# Patient Record
Sex: Male | Born: 1960
Health system: Southern US, Community
[De-identification: ages and names within clinical notes are randomized; demographics above are authoritative.]

## PROBLEM LIST (undated history)

## (undated) DIAGNOSIS — F329 Major depressive disorder, single episode, unspecified: Secondary | ICD-10-CM

## (undated) DIAGNOSIS — L409 Psoriasis, unspecified: Secondary | ICD-10-CM

## (undated) DIAGNOSIS — F909 Attention-deficit hyperactivity disorder, unspecified type: Secondary | ICD-10-CM

## (undated) DIAGNOSIS — IMO0002 Reserved for concepts with insufficient information to code with codable children: Secondary | ICD-10-CM

## (undated) DIAGNOSIS — F32A Depression, unspecified: Secondary | ICD-10-CM

## (undated) DIAGNOSIS — N401 Enlarged prostate with lower urinary tract symptoms: Secondary | ICD-10-CM

## (undated) DIAGNOSIS — F319 Bipolar disorder, unspecified: Secondary | ICD-10-CM

## (undated) HISTORY — PX: COLONOSCOPY: SHX174

## (undated) HISTORY — DX: Bipolar disorder, unspecified: F31.9

## (undated) HISTORY — PX: TONSILLECTOMY: SUR1361

## (undated) HISTORY — DX: Psoriasis, unspecified: L40.9

---

## 2008-11-02 ENCOUNTER — Emergency Department (HOSPITAL_COMMUNITY): Admission: EM | Admit: 2008-11-02 | Discharge: 2008-11-02 | Payer: Self-pay | Admitting: Emergency Medicine

## 2010-08-03 LAB — URINALYSIS, ROUTINE W REFLEX MICROSCOPIC
Nitrite: NEGATIVE
Specific Gravity, Urine: 1.025 (ref 1.005–1.030)
Urobilinogen, UA: 0.2 mg/dL (ref 0.0–1.0)

## 2010-08-03 LAB — CBC
Hemoglobin: 16 g/dL (ref 13.0–17.0)
RBC: 4.43 MIL/uL (ref 4.22–5.81)
RDW: 12.2 % (ref 11.5–15.5)

## 2010-08-03 LAB — COMPREHENSIVE METABOLIC PANEL
ALT: 49 U/L (ref 0–53)
AST: 59 U/L — ABNORMAL HIGH (ref 0–37)
Alkaline Phosphatase: 65 U/L (ref 39–117)
GFR calc Af Amer: >60 mL/min (ref >60)
Glucose, Bld: 159 mg/dL — ABNORMAL HIGH (ref 70–99)
Potassium: 3.2 mEq/L — ABNORMAL LOW (ref 3.5–5.1)
Sodium: 140 mEq/L (ref 135–145)
Total Protein: 7.7 g/dL (ref 6.0–8.3)

## 2010-08-03 LAB — URINE MICROSCOPIC-ADD ON

## 2010-08-03 LAB — DIFFERENTIAL
Basophils Relative: 1 % (ref 0–1)
Eosinophils Absolute: 0 10*3/uL (ref 0.0–0.7)
Eosinophils Relative: 0 % (ref 0–5)
Lymphs Abs: 0.9 10*3/uL (ref 0.7–4.0)
Monocytes Absolute: 1 10*3/uL (ref 0.1–1.0)
Monocytes Relative: 7 % (ref 3–12)
Neutrophils Relative %: 86 % — ABNORMAL HIGH (ref 43–77)

## 2010-08-03 LAB — URINE CULTURE: Culture: NO GROWTH

## 2010-08-03 LAB — SAMPLE TO BLOOD BANK

## 2010-08-03 LAB — ETHANOL: Alcohol, Ethyl (B): <5 mg/dL (ref 0–10)

## 2010-08-03 LAB — PROTIME-INR: INR: 1 (ref 0.00–1.49)

## 2010-08-03 LAB — HEMOCCULT GUIAC POC 1CARD (OFFICE): Fecal Occult Bld: POSITIVE

## 2011-05-21 ENCOUNTER — Encounter (HOSPITAL_COMMUNITY): Payer: Self-pay | Admitting: Emergency Medicine

## 2011-05-21 ENCOUNTER — Emergency Department (HOSPITAL_COMMUNITY): Payer: BC Managed Care – PPO

## 2011-05-21 ENCOUNTER — Emergency Department (HOSPITAL_COMMUNITY)
Admission: EM | Admit: 2011-05-21 | Discharge: 2011-05-21 | Disposition: A | Discharge status: Home / Self Care | Payer: BC Managed Care – PPO | Attending: Emergency Medicine | Admitting: Emergency Medicine

## 2011-05-21 DIAGNOSIS — R112 Nausea with vomiting, unspecified: Secondary | ICD-10-CM | POA: Insufficient documentation

## 2011-05-21 DIAGNOSIS — G4700 Insomnia, unspecified: Secondary | ICD-10-CM | POA: Insufficient documentation

## 2011-05-21 DIAGNOSIS — R1110 Vomiting, unspecified: Secondary | ICD-10-CM

## 2011-05-21 HISTORY — DX: Depression, unspecified: F32.A

## 2011-05-21 HISTORY — DX: Major depressive disorder, single episode, unspecified: F32.9

## 2011-05-21 HISTORY — DX: Reserved for concepts with insufficient information to code with codable children: IMO0002

## 2011-05-21 LAB — URINALYSIS, ROUTINE W REFLEX MICROSCOPIC
Hgb urine dipstick: NEGATIVE
Ketones, ur: >80 mg/dL — AB
Specific Gravity, Urine: 1.02 (ref 1.005–1.030)
pH: 6 (ref 5.0–8.0)

## 2011-05-21 LAB — CK: Total CK: 134 U/L (ref 7–232)

## 2011-05-21 LAB — DIFFERENTIAL
Basophils Relative: 0 % (ref 0–1)
Eosinophils Absolute: 0 10*3/uL (ref 0.0–0.7)
Eosinophils Relative: 0 % (ref 0–5)
Lymphocytes Relative: 4 % — ABNORMAL LOW (ref 12–46)
Monocytes Relative: 6 % (ref 3–12)
Neutro Abs: 17.2 10*3/uL — ABNORMAL HIGH (ref 1.7–7.7)
Neutrophils Relative %: 90 % — ABNORMAL HIGH (ref 43–77)

## 2011-05-21 LAB — COMPREHENSIVE METABOLIC PANEL
ALT: 37 U/L (ref 0–53)
Alkaline Phosphatase: 87 U/L (ref 39–117)
BUN: 12 mg/dL (ref 6–23)
Chloride: 95 mEq/L — ABNORMAL LOW (ref 96–112)
GFR calc Af Amer: >90 mL/min (ref >90)
Glucose, Bld: 176 mg/dL — ABNORMAL HIGH (ref 70–99)
Potassium: 3.8 mEq/L (ref 3.5–5.1)
Sodium: 142 mEq/L (ref 135–145)
Total Bilirubin: 2.5 mg/dL — ABNORMAL HIGH (ref 0.3–1.2)
Total Protein: 8.3 g/dL (ref 6.0–8.3)

## 2011-05-21 LAB — URINE MICROSCOPIC-ADD ON

## 2011-05-21 LAB — LIPASE, BLOOD: Lipase: 122 U/L — ABNORMAL HIGH (ref 11–59)

## 2011-05-21 LAB — CBC
HCT: 46.1 % (ref 39.0–52.0)
Hemoglobin: 16.7 g/dL (ref 13.0–17.0)
RBC: 4.91 MIL/uL (ref 4.22–5.81)
WBC: 19.2 10*3/uL — ABNORMAL HIGH (ref 4.0–10.5)

## 2011-05-21 MED ORDER — ONDANSETRON HCL 4 MG/2ML IJ SOLN
4.0000 mg | Freq: Once | INTRAMUSCULAR | Status: AC
  Administered 2011-05-21: 4 mg via INTRAVENOUS
  Filled 2011-05-21 (×2): qty 2

## 2011-05-21 MED ORDER — GI COCKTAIL ~~LOC~~
30.0000 mL | Freq: Once | ORAL | Status: AC
  Administered 2011-05-21: 30 mL via ORAL
  Filled 2011-05-21: qty 30

## 2011-05-21 MED ORDER — SODIUM CHLORIDE 0.9 % IV SOLN
999.0000 mL | Freq: Once | INTRAVENOUS | Status: AC
  Administered 2011-05-21: 999 mL via INTRAVENOUS

## 2011-05-21 MED ORDER — LORAZEPAM 2 MG/ML IJ SOLN
1.0000 mg | Freq: Once | INTRAMUSCULAR | Status: AC
  Administered 2011-05-21: 1 mg via INTRAVENOUS
  Filled 2011-05-21: qty 1

## 2011-05-21 MED ORDER — ONDANSETRON HCL 4 MG PO TABS: 4.0000 mg | ORAL_TABLET | Freq: Four times a day (QID) | ORAL | Status: AC

## 2011-05-21 NOTE — ED Provider Notes (Signed)
History     CSN: [id number]  Arrival date & time 05/21/11  1733   First MD Initiated Contact with Patient 05/21/11 1734      Chief Complaint  Patient presents with  . Emesis    pt to md office with vomiting since this morning and unable to sleep for 3 nights. given zofran by ems but vomiting continues.    (Consider location/radiation/quality/duration/timing/severity/associated sxs/prior treatment) HPI The patient presents with 3 days of insomnia, new nausea, and vomiting.  He notes that prior to this episode of insomnia he has been in his usual state of health.  He does note that he recently started taking a new anxiolytic.  The insomnia began acutely 3 days ago, since that time he has not slept soundly.  Yesterday he gradually developed nausea and anorexia.  These have been persistent since yesterday.  Today his nausea progressed to vomiting.  After several episodes of vomiting the patient presented to his primary care physician's office.  He was referred here for further evaluation.  The patient denies any alleviating factors.  No clear exacerbating factors either.  No significant abdominal pain, chest pain, dyspnea, headache, confusion, disorientation, fevers, chills. Past Medical History  Diagnosis Date  . Ulcer   . Depression     No past surgical history on file.  No family history on file.  History  Substance Use Topics  . Smoking status: Not on file  . Smokeless tobacco: Not on file  . Alcohol Use:       Review of Systems  Constitutional:       Per HPI, otherwise negative  HENT:       Per HPI, otherwise negative  Eyes: Negative.   Respiratory:       Per HPI, otherwise negative  Cardiovascular:       Per HPI, otherwise negative  Gastrointestinal: Positive for nausea and vomiting. Negative for abdominal pain, diarrhea, constipation, blood in stool, abdominal distention and anal bleeding.  Genitourinary: Negative.   Musculoskeletal:       Per HPI, otherwise  negative  Skin: Negative.   Neurological: Negative for syncope.    Allergies  Review of patient's allergies indicates no known allergies.  Home Medications  No current outpatient prescriptions on file.  BP 153/114  Pulse 102  Temp(Src) 98.4 F (36.9 C) (Oral)  Resp 16  Ht 6' (1.829 m)  Wt 154 lb (69.854 kg)  BMI 20.89 kg/m2  SpO2 96%  Physical Exam  Nursing note and vitals reviewed. Constitutional: He is oriented to person, place, and time. He appears well-developed. No distress.       The patient is sitting, seemingly comfortably, with an emesis basin filled with emesis  HENT:  Head: Normocephalic and atraumatic.  Eyes: Conjunctivae and EOM are normal.  Cardiovascular: Normal rate and regular rhythm.   Pulmonary/Chest: Effort normal. No stridor. No respiratory distress.  Abdominal: He exhibits no distension.  Musculoskeletal: He exhibits no edema.  Neurological: He is alert and oriented to person, place, and time.  Skin: Skin is warm and dry.  Psychiatric: He has a normal mood and affect.    ED Course  Procedures (including critical care time)   Labs Reviewed  CBC  DIFFERENTIAL  COMPREHENSIVE METABOLIC PANEL  LIPASE, BLOOD  URINALYSIS, ROUTINE W REFLEX MICROSCOPIC  LACTIC ACID, PLASMA   No results found.   No diagnosis found.  Cardiac monitor: 102, sinus tach, abnormal  MDM  This previously well male now presents with several days of  polydipsia, nausea, vomiting, insomnia.  The patient is awake, alert, oriented, afebrile.  Patient's labs are notable for mild lipase elevation and transaminase elevation.  Given his description of nausea vomiting, he had an ultrasound which did not demonstrate acute pathology.  The patient noted a significant improvement in his clinical condition following IV fluids and medications.  Patient's presentation is most consistent with a viral etiology, though there is some consideration of Cymbalta use, given that this is a new  medication, and noted side effects include the patient's symptoms.  The patient was advised to stop using Cymbalta, continue to stay well-hydrated, and to follow up with his primary care physician to continue evaluation of today's events.        Gerhard Munch, MD 05/21/11 [phone]

## 2011-05-21 NOTE — ED Notes (Signed)
N/v since this morning

## 2011-05-21 NOTE — ED Notes (Signed)
Patient in ultrasound. Will administer ordered medication upon his return

## 2011-05-21 NOTE — ED Notes (Signed)
Pt tolerating sips of gingerale without difficulty, states is feeling better. Ns bolus completed.

## 2013-05-31 IMAGING — US US ABDOMEN LIMITED
1 series · 14 of 25 positions shown · non-contrast
Comparison: None.

CLINICAL DATA: Nausea and vomiting.  Elevated lipase and liver
function tests.

COMPLETE ABDOMINAL ULTRASOUND

[Series 1: us abdomen limited · 0.27mm/px · 14 of 50 slices shown]
[im 1/50]
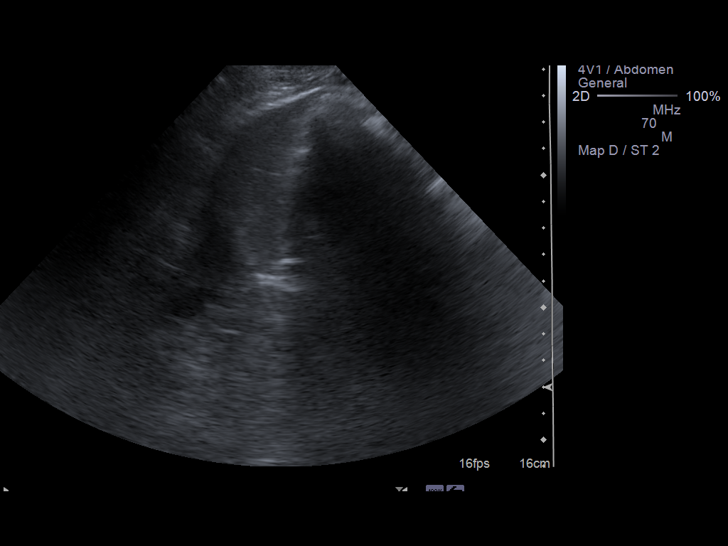
[im 5/50]
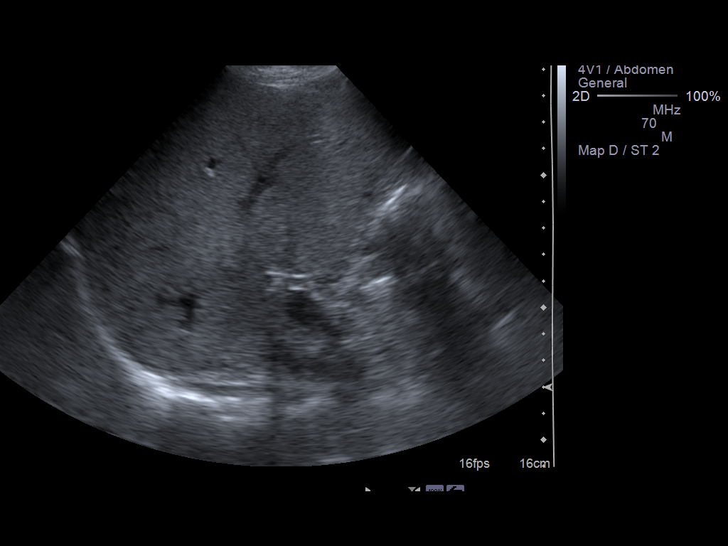
[im 9/50]
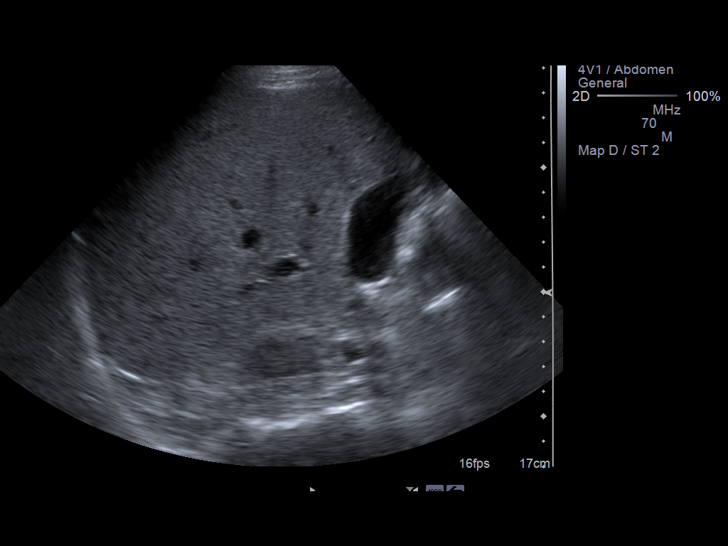
[im 13/50]
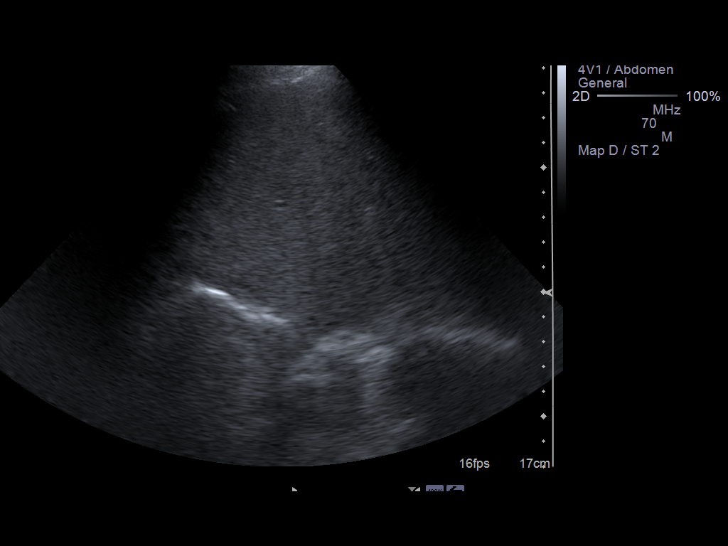
[im 17/50]
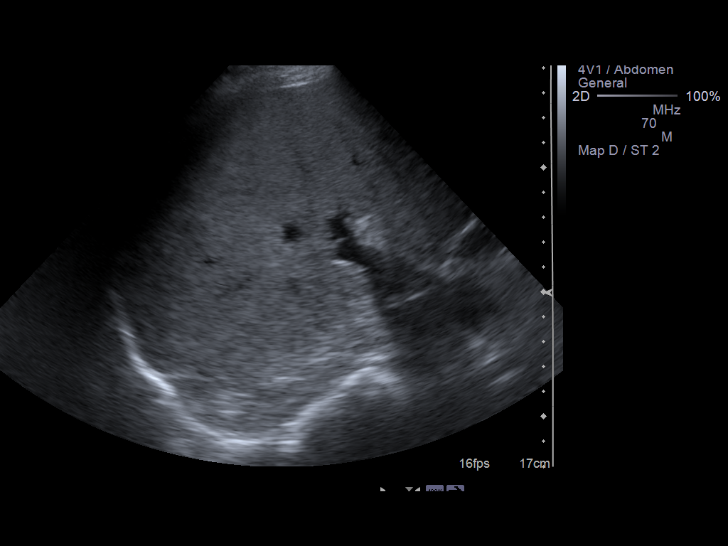
[im 19/50]
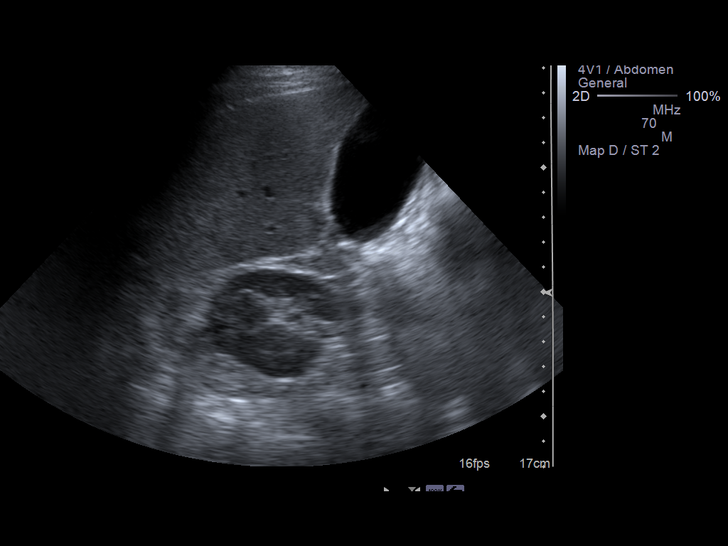
[im 23/50]
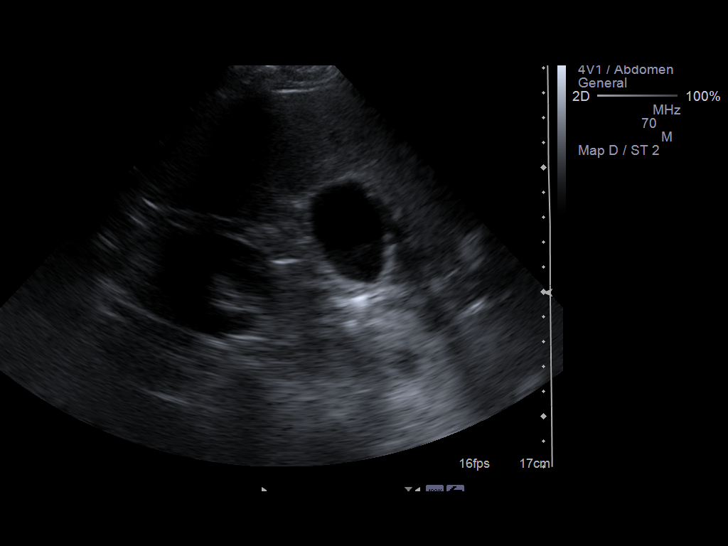
[im 27/50]
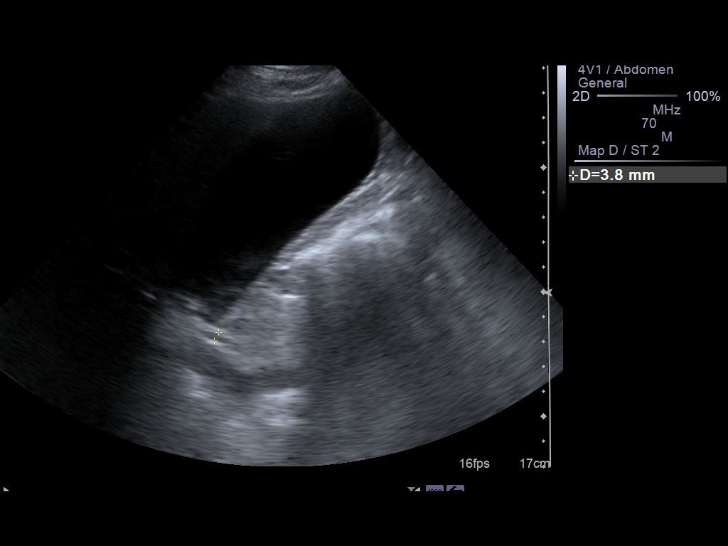
[im 31/50]
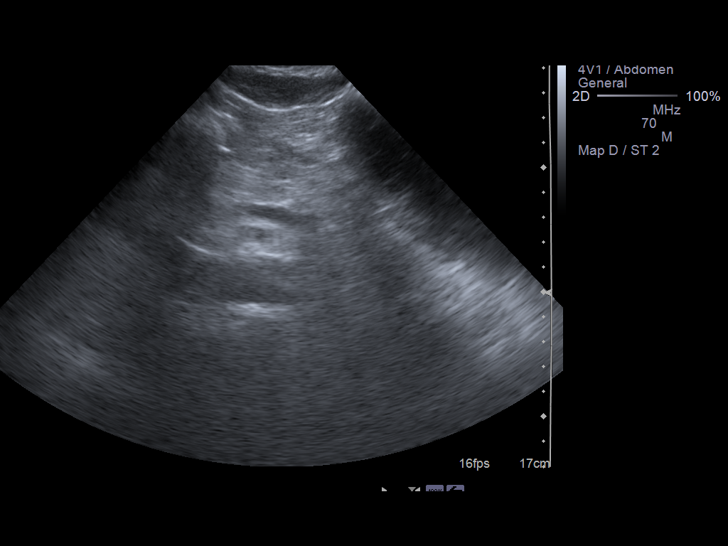
[im 33/50]
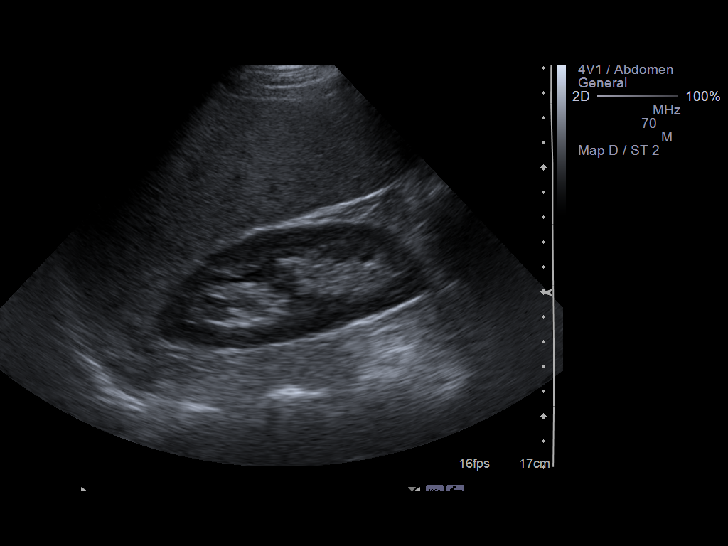
[im 37/50]
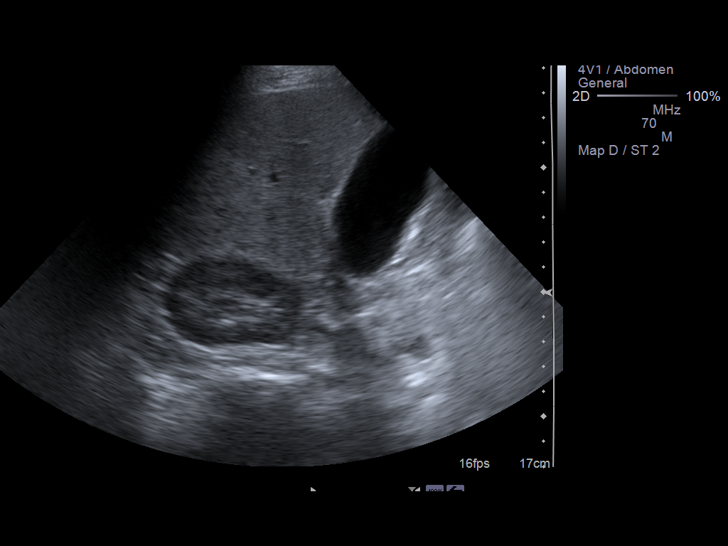
[im 41/50]
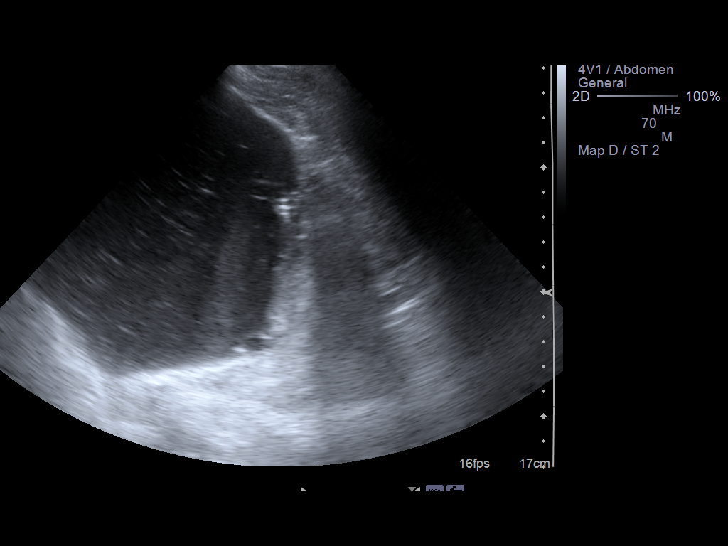
[im 45/50]
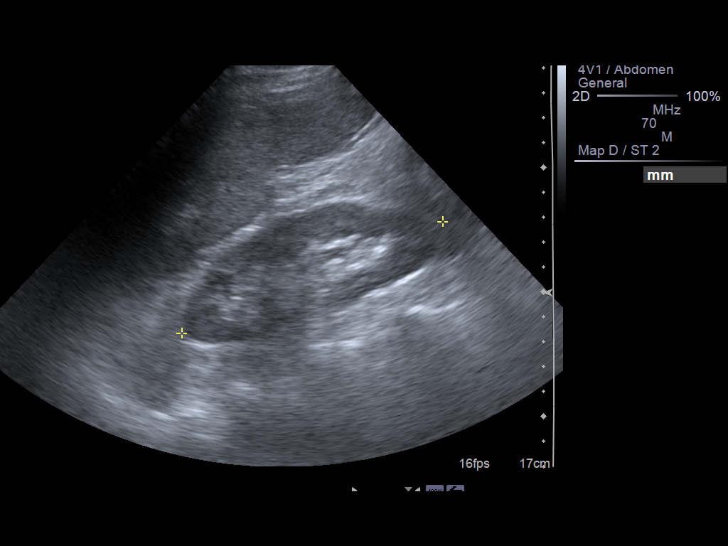
[im 50/50]
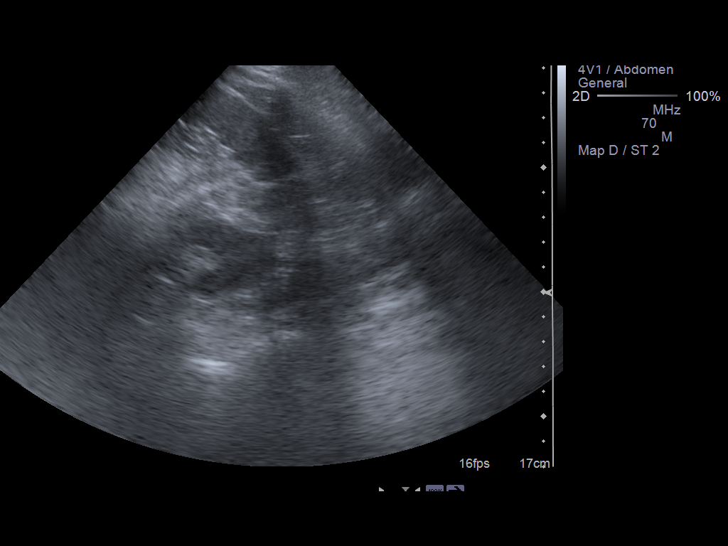

[14 of 25 positions shown; findings below may reference images not displayed]

FINDINGS: Gallbladder:  A small amount of sludge is present in the
gallbladder.  No gallbladder wall thickening or pericholecystic
fluid is identified.  No stone is seen.  Sonographer reports
negative Murphy's sign.

Common bile duct:  Measures 0.5 cm.

Liver:  No focal lesion identified.  Within normal limits in
parenchymal echogenicity.

IVC:  Appears normal.

Pancreas:  No focal abnormality seen.

Spleen:  Measures 13.1 cm and appears normal.

Right Kidney:  Measures 11.3 cm and appears normal.

Left Kidney:  Measures measures 11.4 cm and appears normal.

Abdominal aorta:  No aneurysm identified.
IMPRESSION: Small amount of gallbladder sludge.  No stones or evidence of
cholecystitis.  The examination is otherwise unremarkable.

## 2015-05-01 DIAGNOSIS — F1020 Alcohol dependence, uncomplicated: Secondary | ICD-10-CM | POA: Diagnosis not present

## 2015-05-06 MED FILL — HUMIRA PEN 40 MG/0.8ML PNKT: 40 | 28 days supply | Qty: 2 | Fill #1

## 2015-05-07 MED FILL — LITHIUM ER 450 MG TABLET: 450 | 30 days supply | Qty: 75 | Fill #1

## 2015-05-13 DIAGNOSIS — R3915 Urgency of urination: Secondary | ICD-10-CM | POA: Diagnosis not present

## 2015-05-13 DIAGNOSIS — N390 Urinary tract infection, site not specified: Secondary | ICD-10-CM | POA: Diagnosis not present

## 2015-05-13 MED FILL — CIPROFLOXACIN HCL 500 MG TA: 500 | 7 days supply | Qty: 14 | Fill #0

## 2015-05-15 DIAGNOSIS — F3132 Bipolar disorder, current episode depressed, moderate: Secondary | ICD-10-CM | POA: Diagnosis not present

## 2015-05-15 DIAGNOSIS — F1020 Alcohol dependence, uncomplicated: Secondary | ICD-10-CM | POA: Diagnosis not present

## 2015-05-17 DIAGNOSIS — M79661 Pain in right lower leg: Secondary | ICD-10-CM | POA: Diagnosis not present

## 2015-05-17 DIAGNOSIS — R5383 Other fatigue: Secondary | ICD-10-CM | POA: Diagnosis not present

## 2015-05-17 DIAGNOSIS — R270 Ataxia, unspecified: Secondary | ICD-10-CM | POA: Diagnosis not present

## 2015-05-17 DIAGNOSIS — R945 Abnormal results of liver function studies: Secondary | ICD-10-CM | POA: Diagnosis not present

## 2015-05-17 DIAGNOSIS — E039 Hypothyroidism, unspecified: Secondary | ICD-10-CM | POA: Diagnosis not present

## 2015-05-20 MED FILL — QUETIAPINE FUMARATE 50 MG T: 50 | 30 days supply | Qty: 90 | Fill #4

## 2015-05-21 DIAGNOSIS — R3915 Urgency of urination: Secondary | ICD-10-CM | POA: Diagnosis not present

## 2015-05-21 DIAGNOSIS — T56891A Toxic effect of other metals, accidental (unintentional), initial encounter: Secondary | ICD-10-CM | POA: Diagnosis not present

## 2015-05-21 DIAGNOSIS — R945 Abnormal results of liver function studies: Secondary | ICD-10-CM | POA: Diagnosis not present

## 2015-05-21 DIAGNOSIS — R251 Tremor, unspecified: Secondary | ICD-10-CM | POA: Diagnosis not present

## 2015-05-29 DIAGNOSIS — F1020 Alcohol dependence, uncomplicated: Secondary | ICD-10-CM | POA: Diagnosis not present

## 2015-05-31 DIAGNOSIS — F3177 Bipolar disorder, in partial remission, most recent episode mixed: Secondary | ICD-10-CM | POA: Diagnosis not present

## 2015-05-31 DIAGNOSIS — Z79899 Other long term (current) drug therapy: Secondary | ICD-10-CM | POA: Diagnosis not present

## 2015-06-04 DIAGNOSIS — R3915 Urgency of urination: Secondary | ICD-10-CM | POA: Diagnosis not present

## 2015-06-04 DIAGNOSIS — D72829 Elevated white blood cell count, unspecified: Secondary | ICD-10-CM | POA: Diagnosis not present

## 2015-06-04 DIAGNOSIS — E039 Hypothyroidism, unspecified: Secondary | ICD-10-CM | POA: Diagnosis not present

## 2015-06-12 DIAGNOSIS — F1020 Alcohol dependence, uncomplicated: Secondary | ICD-10-CM | POA: Diagnosis not present

## 2015-06-19 MED FILL — HUMIRA PEN 40 MG/0.8ML PNKT: 40 | 28 days supply | Qty: 2 | Fill #2

## 2015-06-20 DIAGNOSIS — N390 Urinary tract infection, site not specified: Secondary | ICD-10-CM | POA: Diagnosis not present

## 2015-06-20 DIAGNOSIS — N410 Acute prostatitis: Secondary | ICD-10-CM | POA: Diagnosis not present

## 2015-06-20 DIAGNOSIS — Z0000 Encounter for general adult medical examination without abnormal findings: Secondary | ICD-10-CM | POA: Diagnosis not present

## 2015-06-20 DIAGNOSIS — R3915 Urgency of urination: Secondary | ICD-10-CM | POA: Diagnosis not present

## 2015-06-20 DIAGNOSIS — N486 Induration penis plastica: Secondary | ICD-10-CM | POA: Diagnosis not present

## 2015-06-20 DIAGNOSIS — N411 Chronic prostatitis: Secondary | ICD-10-CM | POA: Diagnosis not present

## 2015-06-20 DIAGNOSIS — Z125 Encounter for screening for malignant neoplasm of prostate: Secondary | ICD-10-CM | POA: Diagnosis not present

## 2015-06-20 MED FILL — MELOXICAM 15 MG TABLET: 15 | 15 days supply | Qty: 15 | Fill #0

## 2015-06-20 MED FILL — CIPROFLOXACIN HCL 500 MG TA: 500 | 30 days supply | Qty: 60 | Fill #0

## 2015-06-21 MED FILL — LITHIUM ER 450 MG TABLET: 450 | 30 days supply | Qty: 45 | Fill #0

## 2015-06-26 DIAGNOSIS — F1020 Alcohol dependence, uncomplicated: Secondary | ICD-10-CM | POA: Diagnosis not present

## 2015-06-27 DIAGNOSIS — F3132 Bipolar disorder, current episode depressed, moderate: Secondary | ICD-10-CM | POA: Diagnosis not present

## 2015-07-10 DIAGNOSIS — F3132 Bipolar disorder, current episode depressed, moderate: Secondary | ICD-10-CM | POA: Diagnosis not present

## 2015-07-10 DIAGNOSIS — F1020 Alcohol dependence, uncomplicated: Secondary | ICD-10-CM | POA: Diagnosis not present

## 2015-07-18 DIAGNOSIS — R3915 Urgency of urination: Secondary | ICD-10-CM | POA: Diagnosis not present

## 2015-07-18 DIAGNOSIS — Z0000 Encounter for general adult medical examination without abnormal findings: Secondary | ICD-10-CM | POA: Diagnosis not present

## 2015-07-18 DIAGNOSIS — N486 Induration penis plastica: Secondary | ICD-10-CM | POA: Diagnosis not present

## 2015-07-22 MED FILL — HUMIRA PEN 40 MG/0.8ML PNKT: 40 | 28 days supply | Qty: 2 | Fill #3

## 2015-07-24 DIAGNOSIS — F1020 Alcohol dependence, uncomplicated: Secondary | ICD-10-CM | POA: Diagnosis not present

## 2015-08-01 MED FILL — LITHIUM ER 450 MG TABLET: 450 | 30 days supply | Qty: 45 | Fill #1

## 2015-08-02 DIAGNOSIS — F3132 Bipolar disorder, current episode depressed, moderate: Secondary | ICD-10-CM | POA: Diagnosis not present

## 2015-08-15 MED FILL — QUETIAPINE FUMARATE 50 MG T: 50 | 30 days supply | Qty: 90 | Fill #5

## 2015-08-20 DIAGNOSIS — F3132 Bipolar disorder, current episode depressed, moderate: Secondary | ICD-10-CM | POA: Diagnosis not present

## 2015-08-21 DIAGNOSIS — F1020 Alcohol dependence, uncomplicated: Secondary | ICD-10-CM | POA: Diagnosis not present

## 2015-09-02 MED FILL — HUMIRA PEN 40 MG/0.8ML PNKT: 40 | 28 days supply | Qty: 2 | Fill #4

## 2015-09-03 DIAGNOSIS — E039 Hypothyroidism, unspecified: Secondary | ICD-10-CM | POA: Diagnosis not present

## 2015-09-03 DIAGNOSIS — B356 Tinea cruris: Secondary | ICD-10-CM | POA: Diagnosis not present

## 2015-09-03 MED FILL — CLOTRIMAZOLE-BETAMETHASONE: 1-0.05 | 10 days supply | Qty: 45 | Fill #0

## 2015-09-04 DIAGNOSIS — F1020 Alcohol dependence, uncomplicated: Secondary | ICD-10-CM | POA: Diagnosis not present

## 2015-09-18 DIAGNOSIS — F1020 Alcohol dependence, uncomplicated: Secondary | ICD-10-CM | POA: Diagnosis not present

## 2015-09-23 MED FILL — LITHIUM ER 450 MG TABLET: 450 | 30 days supply | Qty: 45 | Fill #0

## 2015-10-02 DIAGNOSIS — F3132 Bipolar disorder, current episode depressed, moderate: Secondary | ICD-10-CM | POA: Diagnosis not present

## 2015-10-04 MED FILL — HUMIRA PEN 40 MG/0.8ML PNKT: 40 | 28 days supply | Qty: 2 | Fill #0

## 2015-10-09 DIAGNOSIS — F1020 Alcohol dependence, uncomplicated: Secondary | ICD-10-CM | POA: Diagnosis not present

## 2015-10-14 ENCOUNTER — Ambulatory Visit (HOSPITAL_BASED_OUTPATIENT_CLINIC_OR_DEPARTMENT_OTHER): Payer: Self-pay | Admitting: Pharmacist

## 2015-10-14 DIAGNOSIS — L409 Psoriasis, unspecified: Secondary | ICD-10-CM

## 2015-10-14 NOTE — Progress Notes (Signed)
S: Patient presents today to the [facility].  Patient is currently taking Humira for psoriasis. Patient is managed by Dr. Denna Haggard for this.   Adherence: denies any missed doses  Dosing:  Plaque psoriasis: SubQ: Initial: 80 mg as a single dose Maintenance: 40 mg every other week beginning 1 week after initial dose  Drug-drug interactions: none  Screening: TB test: completed Hepatitis: completed  Monitoring: S/sx of infection: denies CBC: WNL S/sx of hypersensitivity: denies S/sx of malignancy: denies S/sx of heart failure: denies     O:     Lab Results  Component Value Date   WBC 19.2* 05/21/2011   HGB 16.7 05/21/2011   HCT 46.1 05/21/2011   MCV 93.9 05/21/2011   PLT 216 05/21/2011      Chemistry      Component Value Date/Time   NA 142 05/21/2011 1753   K 3.8 05/21/2011 1753   CL 95* 05/21/2011 1753   CO2 16* 05/21/2011 1753   BUN 12 05/21/2011 1753   CREATININE 1.08 05/21/2011 1753      Component Value Date/Time   CALCIUM 9.8 05/21/2011 1753   ALKPHOS 87 05/21/2011 1753   AST 48* 05/21/2011 1753   ALT 37 05/21/2011 1753   BILITOT 2.5* 05/21/2011 1753     Reviewed labs from 2017 in [facility] - all pertinent labs WNL.  A/P: 1. Medication review: Patient on Humira for psoriasis and is tolerating it well with self-reported significant improvement in psoriasis. Reviewed the medication with the patient, including the following: Humira is a TNF blocking agent indicated for ankylosing spondylitis, Crohn's disease, Hidradenitis suppurativa, psoriatic arthritis, plaque psoriasis, ulcerative colitis, and uveitis. The most common adverse effects are infections, headache, and injection site reactions. There is the possibility of an increased risk of malignancy but it is not well understood if this increased risk is due to there medication or the disease state. There are rare cases of pancytopenia and aplastic anemia. No  recommendations for changes.    Nicoletta Ba, PharmD, BCPS, [facility] [phone]  Evaluation and management procedures were performed by the Clinical Pharmacy Practitioner under my supervision and collaboration. I have reviewed the Practitioner's note and chart, and I agree with the management and plan as documented above.   Angelica Chessman, MD, [place], [place], [place], [place] [facility] [place], [place]   10/14/2015, 3:47 PM

## 2015-10-15 DIAGNOSIS — F3132 Bipolar disorder, current episode depressed, moderate: Secondary | ICD-10-CM | POA: Diagnosis not present

## 2015-10-16 DIAGNOSIS — F1020 Alcohol dependence, uncomplicated: Secondary | ICD-10-CM | POA: Diagnosis not present

## 2015-10-17 MED FILL — LITHIUM ER 450 MG TABLET: 450 | 30 days supply | Qty: 45 | Fill #0

## 2015-10-25 DIAGNOSIS — F3132 Bipolar disorder, current episode depressed, moderate: Secondary | ICD-10-CM | POA: Diagnosis not present

## 2015-11-04 ENCOUNTER — Other Ambulatory Visit: Payer: Self-pay | Admitting: Pharmacist

## 2015-11-04 MED ORDER — ADALIMUMAB 40 MG/0.8ML ~~LOC~~ AJKT: 40.0000 mg | AUTO-INJECTOR | SUBCUTANEOUS | Status: DC

## 2015-11-04 MED FILL — HUMIRA PEN 40 MG/0.8ML PNKT: 40 | 28 days supply | Qty: 2 | Fill #0

## 2015-11-13 DIAGNOSIS — F1020 Alcohol dependence, uncomplicated: Secondary | ICD-10-CM | POA: Diagnosis not present

## 2015-11-15 MED FILL — LITHIUM ER 450 MG TABLET: 450 | 30 days supply | Qty: 45 | Fill #1

## 2015-11-15 MED FILL — QUETIAPINE FUMARATE 50 MG T: 50 | 30 days supply | Qty: 90 | Fill #0

## 2015-11-26 ENCOUNTER — Other Ambulatory Visit: Payer: Self-pay | Admitting: Internal Medicine

## 2015-11-27 ENCOUNTER — Other Ambulatory Visit: Payer: Self-pay | Admitting: Pharmacist

## 2015-11-27 DIAGNOSIS — F1020 Alcohol dependence, uncomplicated: Secondary | ICD-10-CM | POA: Diagnosis not present

## 2015-11-27 MED ORDER — ADALIMUMAB 40 MG/0.8ML ~~LOC~~ AJKT: 40.0000 mg | AUTO-INJECTOR | SUBCUTANEOUS | 0 refills | Status: DC

## 2015-12-02 MED FILL — HUMIRA PEN 40 MG/0.8ML PNKT: 40 | 28 days supply | Qty: 2 | Fill #0

## 2015-12-04 DIAGNOSIS — L409 Psoriasis, unspecified: Secondary | ICD-10-CM | POA: Diagnosis not present

## 2015-12-11 DIAGNOSIS — F1020 Alcohol dependence, uncomplicated: Secondary | ICD-10-CM | POA: Diagnosis not present

## 2015-12-13 DIAGNOSIS — F3132 Bipolar disorder, current episode depressed, moderate: Secondary | ICD-10-CM | POA: Diagnosis not present

## 2015-12-13 MED FILL — LITHIUM ER 450 MG TABLET: 450 | 30 days supply | Qty: 45 | Fill #2

## 2015-12-23 ENCOUNTER — Other Ambulatory Visit: Payer: Self-pay | Admitting: Pharmacist

## 2015-12-23 MED ORDER — ADALIMUMAB 40 MG/0.8ML ~~LOC~~ AJKT: 40.0000 mg | AUTO-INJECTOR | SUBCUTANEOUS | 5 refills | Status: DC

## 2015-12-23 MED FILL — HUMIRA PEN 40 MG/0.8ML PNKT: 40 | 28 days supply | Qty: 2 | Fill #0

## 2016-01-07 DIAGNOSIS — F3132 Bipolar disorder, current episode depressed, moderate: Secondary | ICD-10-CM | POA: Diagnosis not present

## 2016-01-08 DIAGNOSIS — F1020 Alcohol dependence, uncomplicated: Secondary | ICD-10-CM | POA: Diagnosis not present

## 2016-01-16 MED FILL — LITHIUM ER 450 MG TABLET: 450 | 30 days supply | Qty: 45 | Fill #3

## 2016-01-27 MED FILL — HUMIRA PEN 40 MG/0.8ML PNKT: 40 | 28 days supply | Qty: 2 | Fill #1

## 2016-02-04 DIAGNOSIS — M5441 Lumbago with sciatica, right side: Secondary | ICD-10-CM | POA: Diagnosis not present

## 2016-02-13 MED FILL — LITHIUM ER 450 MG TABLET: 450 | 30 days supply | Qty: 45 | Fill #0

## 2016-02-13 MED FILL — QUETIAPINE FUMARATE 50 MG T: 50 | 30 days supply | Qty: 90 | Fill #1

## 2016-02-27 DIAGNOSIS — E039 Hypothyroidism, unspecified: Secondary | ICD-10-CM | POA: Diagnosis not present

## 2016-02-27 DIAGNOSIS — Z23 Encounter for immunization: Secondary | ICD-10-CM | POA: Diagnosis not present

## 2016-02-27 DIAGNOSIS — E7800 Pure hypercholesterolemia, unspecified: Secondary | ICD-10-CM | POA: Diagnosis not present

## 2016-02-27 DIAGNOSIS — Z0000 Encounter for general adult medical examination without abnormal findings: Secondary | ICD-10-CM | POA: Diagnosis not present

## 2016-02-27 DIAGNOSIS — Z125 Encounter for screening for malignant neoplasm of prostate: Secondary | ICD-10-CM | POA: Diagnosis not present

## 2016-02-27 DIAGNOSIS — F321 Major depressive disorder, single episode, moderate: Secondary | ICD-10-CM | POA: Diagnosis not present

## 2016-03-06 MED FILL — HUMIRA PEN 40 MG/0.8ML PNKT: 40 | 28 days supply | Qty: 2 | Fill #2

## 2016-03-13 MED FILL — LITHIUM ER 450 MG TABLET: 450 | 30 days supply | Qty: 45 | Fill #1

## 2016-04-01 DIAGNOSIS — D72829 Elevated white blood cell count, unspecified: Secondary | ICD-10-CM | POA: Diagnosis not present

## 2016-04-12 MED FILL — HUMIRA PEN 40 MG/0.8ML PNKT: 40 | 28 days supply | Qty: 2 | Fill #3

## 2016-04-12 MED FILL — LITHIUM ER 450 MG TABLET: 450 | 30 days supply | Qty: 45 | Fill #2

## 2016-04-28 DIAGNOSIS — F3132 Bipolar disorder, current episode depressed, moderate: Secondary | ICD-10-CM | POA: Diagnosis not present

## 2016-04-28 MED FILL — ZALEPLON 10 MG CAPSULE: 10 | 30 days supply | Qty: 30 | Fill #0

## 2016-05-13 MED FILL — QUETIAPINE FUMARATE 50 MG T: 50 | 30 days supply | Qty: 90 | Fill #2

## 2016-05-13 MED FILL — LITHIUM ER 450 MG TABLET: 450 | 30 days supply | Qty: 45 | Fill #3

## 2016-05-18 MED FILL — HUMIRA PEN 40 MG/0.8ML PNKT: 40 | 28 days supply | Qty: 2 | Fill #4

## 2016-06-16 MED FILL — LITHIUM ER 450 MG TABLET: 450 | 30 days supply | Qty: 45 | Fill #0

## 2016-06-24 DIAGNOSIS — C4401 Basal cell carcinoma of skin of lip: Secondary | ICD-10-CM | POA: Diagnosis not present

## 2016-06-24 DIAGNOSIS — D229 Melanocytic nevi, unspecified: Secondary | ICD-10-CM | POA: Diagnosis not present

## 2016-06-24 DIAGNOSIS — L409 Psoriasis, unspecified: Secondary | ICD-10-CM | POA: Diagnosis not present

## 2016-06-24 DIAGNOSIS — L4052 Psoriatic arthritis mutilans: Secondary | ICD-10-CM | POA: Diagnosis not present

## 2016-07-15 MED FILL — LITHIUM ER 450 MG TABLET: 450 | 30 days supply | Qty: 45 | Fill #1

## 2016-07-17 DIAGNOSIS — K529 Noninfective gastroenteritis and colitis, unspecified: Secondary | ICD-10-CM | POA: Diagnosis not present

## 2016-07-21 MED FILL — PANTOPRAZOLE SOD DR 40 MG T: 40 | 30 days supply | Qty: 30 | Fill #0

## 2016-08-11 MED FILL — LITHIUM ER 450 MG TABLET: 450 | 30 days supply | Qty: 45 | Fill #2

## 2016-08-12 MED FILL — QUETIAPINE FUMARATE 50 MG T: 50 | 30 days supply | Qty: 90 | Fill #0

## 2016-08-17 MED FILL — CLOTRIMAZOLE-BETAMETHASONE: 1-0.05 | 10 days supply | Qty: 45 | Fill #0

## 2016-08-19 DIAGNOSIS — F3176 Bipolar disorder, in full remission, most recent episode depressed: Secondary | ICD-10-CM | POA: Diagnosis not present

## 2016-08-19 DIAGNOSIS — F3132 Bipolar disorder, current episode depressed, moderate: Secondary | ICD-10-CM | POA: Diagnosis not present

## 2016-08-20 MED FILL — PANTOPRAZOLE SOD DR 40 MG T: 40 | 30 days supply | Qty: 30 | Fill #0

## 2016-09-09 MED FILL — LITHIUM ER 450 MG TABLET: 450 | 30 days supply | Qty: 45 | Fill #3

## 2016-09-17 MED FILL — HUMIRA PEN 40 MG/0.8ML PNKT: 40 | 28 days supply | Qty: 2 | Fill #5

## 2016-10-01 DIAGNOSIS — F3176 Bipolar disorder, in full remission, most recent episode depressed: Secondary | ICD-10-CM | POA: Diagnosis not present

## 2016-10-05 MED FILL — LITHIUM ER 450 MG TABLET: 450 | 30 days supply | Qty: 60 | Fill #0

## 2016-10-06 DIAGNOSIS — H524 Presbyopia: Secondary | ICD-10-CM | POA: Diagnosis not present

## 2016-10-22 ENCOUNTER — Other Ambulatory Visit: Payer: Self-pay | Admitting: Pharmacist

## 2016-10-22 MED ORDER — ADALIMUMAB 40 MG/0.4ML ~~LOC~~ PSKT: 40.0000 mg | PREFILLED_SYRINGE | SUBCUTANEOUS | 5 refills | Status: DC

## 2016-10-22 MED ORDER — ADALIMUMAB 40 MG/0.8ML ~~LOC~~ AJKT: 40.0000 mg | AUTO-INJECTOR | SUBCUTANEOUS | 5 refills | Status: DC

## 2016-10-22 MED FILL — HUMIRA PEN 40 MG/0.8ML PNKT: 40 | 28 days supply | Qty: 2 | Fill #0

## 2016-11-03 MED FILL — LITHIUM ER 450 MG TABLET: 450 | 30 days supply | Qty: 60 | Fill #0

## 2016-11-11 MED FILL — QUETIAPINE FUMARATE 50 MG T: 50 | 30 days supply | Qty: 90 | Fill #1

## 2016-11-23 MED FILL — HUMIRA PEN 40 MG/0.8ML PNKT: 40 | 28 days supply | Qty: 2 | Fill #1

## 2016-12-02 MED FILL — LITHIUM ER 450 MG TABLET: 450 | 30 days supply | Qty: 60 | Fill #1

## 2016-12-31 MED FILL — LITHIUM ER 450 MG TABLET: 450 | 30 days supply | Qty: 60 | Fill #2

## 2016-12-31 MED FILL — HUMIRA PEN 40 MG/0.8ML PNKT: 40 | 28 days supply | Qty: 2 | Fill #2

## 2017-02-03 DIAGNOSIS — F3132 Bipolar disorder, current episode depressed, moderate: Secondary | ICD-10-CM | POA: Diagnosis not present

## 2017-02-04 MED FILL — LITHIUM ER 450 MG TABLET: 450 | 30 days supply | Qty: 60 | Fill #0

## 2017-02-04 MED FILL — QUETIAPINE FUMARATE 50 MG T: 50 | 30 days supply | Qty: 90 | Fill #0

## 2017-02-22 MED FILL — HUMIRA PEN 40 MG/0.8ML PNKT: 40 | 28 days supply | Qty: 2 | Fill #3

## 2017-03-01 DIAGNOSIS — Z23 Encounter for immunization: Secondary | ICD-10-CM | POA: Diagnosis not present

## 2017-03-01 DIAGNOSIS — Z79899 Other long term (current) drug therapy: Secondary | ICD-10-CM | POA: Diagnosis not present

## 2017-03-01 DIAGNOSIS — E039 Hypothyroidism, unspecified: Secondary | ICD-10-CM | POA: Diagnosis not present

## 2017-03-01 DIAGNOSIS — Z13220 Encounter for screening for lipoid disorders: Secondary | ICD-10-CM | POA: Diagnosis not present

## 2017-03-01 DIAGNOSIS — Z0000 Encounter for general adult medical examination without abnormal findings: Secondary | ICD-10-CM | POA: Diagnosis not present

## 2017-03-01 DIAGNOSIS — Z125 Encounter for screening for malignant neoplasm of prostate: Secondary | ICD-10-CM | POA: Diagnosis not present

## 2017-03-01 DIAGNOSIS — Z111 Encounter for screening for respiratory tuberculosis: Secondary | ICD-10-CM | POA: Diagnosis not present

## 2017-03-01 DIAGNOSIS — F329 Major depressive disorder, single episode, unspecified: Secondary | ICD-10-CM | POA: Diagnosis not present

## 2017-03-01 DIAGNOSIS — L408 Other psoriasis: Secondary | ICD-10-CM | POA: Diagnosis not present

## 2017-03-02 MED FILL — LITHIUM ER 450 MG TABLET: 450 | 30 days supply | Qty: 60 | Fill #1

## 2017-03-31 MED FILL — LITHIUM ER 450 MG TABLET: 450 | 30 days supply | Qty: 60 | Fill #2

## 2017-05-03 MED FILL — QUETIAPINE FUMARATE 50 MG T: 50 | 30 days supply | Qty: 90 | Fill #1

## 2017-05-03 MED FILL — LITHIUM ER 450 MG TABLET: 450 | 30 days supply | Qty: 60 | Fill #3

## 2017-05-10 MED FILL — HUMIRA PEN 40 MG/0.8ML PNKT: 40 | 28 days supply | Qty: 2 | Fill #4

## 2017-05-31 MED FILL — LITHIUM ER 450 MG TABLET: 450 | 30 days supply | Qty: 60 | Fill #4

## 2017-06-11 ENCOUNTER — Telehealth: Payer: Self-pay | Admitting: Pharmacist

## 2017-06-11 ENCOUNTER — Ambulatory Visit (HOSPITAL_BASED_OUTPATIENT_CLINIC_OR_DEPARTMENT_OTHER): Payer: Self-pay | Admitting: Pharmacist

## 2017-06-11 ENCOUNTER — Other Ambulatory Visit: Payer: Self-pay | Admitting: Pharmacist

## 2017-06-11 ENCOUNTER — Encounter: Payer: Self-pay | Admitting: Pharmacist

## 2017-06-11 DIAGNOSIS — Z79899 Other long term (current) drug therapy: Secondary | ICD-10-CM

## 2017-06-11 DIAGNOSIS — E7800 Pure hypercholesterolemia, unspecified: Secondary | ICD-10-CM | POA: Diagnosis not present

## 2017-06-11 DIAGNOSIS — E782 Mixed hyperlipidemia: Secondary | ICD-10-CM | POA: Diagnosis not present

## 2017-06-11 MED ORDER — ADALIMUMAB 40 MG/0.8ML ~~LOC~~ PSKT: 40.0000 mg | PREFILLED_SYRINGE | SUBCUTANEOUS | 0 refills | Status: DC

## 2017-06-11 NOTE — Telephone Encounter (Signed)
error 

## 2017-06-11 NOTE — Progress Notes (Signed)
S: Patient presents today to the [facility].  Patient is currently taking Humira for psoriasis. Patient is managed by Dr. Denna Haggard for this. He has an appt with derm at the end of the month.  Efficacy: overall, working very well. Still has a few spots but overall, skin is still very clear since starting Humira.   Adherence: denies any missed doses  Dosing:  Plaque psoriasis: SubQ: Initial: 80 mg as a single dose Maintenance: 40 mg every other week beginning 1 week after initial dose  Drug-drug interactions: none  Screening: TB test: completed recently, still needs to give to dermatologist. Hepatitis: completed  Monitoring: S/sx of infection: denies CBC: WNL per patient S/sx of hypersensitivity: denies S/sx of malignancy: denies S/sx of heart failure: denies     O:     Lab Results  Component Value Date   WBC 19.2 (H) 05/21/2011   HGB 16.7 05/21/2011   HCT 46.1 05/21/2011   MCV 93.9 05/21/2011   PLT 216 05/21/2011      Chemistry      Component Value Date/Time   NA 142 05/21/2011 1753   K 3.8 05/21/2011 1753   CL 95 (L) 05/21/2011 1753   CO2 16 (L) 05/21/2011 1753   BUN 12 05/21/2011 1753   CREATININE 1.08 05/21/2011 1753      Component Value Date/Time   CALCIUM 9.8 05/21/2011 1753   ALKPHOS 87 05/21/2011 1753   AST 48 (H) 05/21/2011 1753   ALT 37 05/21/2011 1753   BILITOT 2.5 (H) 05/21/2011 1753      A/P: 1. Medication review: Patient on Humira for psoriasis and is tolerating it well with self-reported significant improvement in psoriasis. Reviewed the medication with the patient, including the following: Humira is a TNF blocking agent indicated for ankylosing spondylitis, Crohn's disease, Hidradenitis suppurativa, psoriatic arthritis, plaque psoriasis, ulcerative colitis, and uveitis. The most common adverse effects are infections, headache, and injection site reactions. There is the possibility of an  increased risk of malignancy but it is not well understood if this increased risk is due to there medication or the disease state. There are rare cases of pancytopenia and aplastic anemia. No recommendations for changes.    Christella Hartigan, PharmD, BCPS, BCACP, [facility] [phone]

## 2017-06-15 MED FILL — ATORVASTATIN 10 MG TABLET: 10 | 30 days supply | Qty: 30 | Fill #0

## 2017-06-17 MED FILL — HUMIRA PEN 40 MG/0.8ML PNKT: 40 | 28 days supply | Qty: 2 | Fill #0

## 2017-06-18 ENCOUNTER — Other Ambulatory Visit: Payer: Self-pay | Admitting: Pharmacist

## 2017-06-21 DIAGNOSIS — L409 Psoriasis, unspecified: Secondary | ICD-10-CM | POA: Diagnosis not present

## 2017-06-21 DIAGNOSIS — D229 Melanocytic nevi, unspecified: Secondary | ICD-10-CM | POA: Diagnosis not present

## 2017-06-21 MED FILL — TRIAMCINOLONE 0.1% CREAM: 0.1 | 30 days supply | Qty: 454 | Fill #0

## 2017-06-30 MED FILL — LITHIUM ER 450 MG TABLET: 450 | 30 days supply | Qty: 60 | Fill #0

## 2017-07-12 DIAGNOSIS — F3176 Bipolar disorder, in full remission, most recent episode depressed: Secondary | ICD-10-CM | POA: Diagnosis not present

## 2017-07-19 MED FILL — ATORVASTATIN 10 MG TABLET: 10 | 30 days supply | Qty: 30 | Fill #1

## 2017-07-21 DIAGNOSIS — F3132 Bipolar disorder, current episode depressed, moderate: Secondary | ICD-10-CM | POA: Diagnosis not present

## 2017-07-26 MED FILL — QUETIAPINE FUMARATE 50 MG T: 50 | 30 days supply | Qty: 90 | Fill #2

## 2017-08-03 MED FILL — LITHIUM ER 450 MG TABLET: 450 | 30 days supply | Qty: 60 | Fill #1

## 2017-08-10 ENCOUNTER — Other Ambulatory Visit: Payer: Self-pay | Admitting: Pharmacist

## 2017-08-10 MED ORDER — ADALIMUMAB 40 MG/0.8ML ~~LOC~~ AJKT: 40.0000 mg | AUTO-INJECTOR | SUBCUTANEOUS | 10 refills | Status: DC

## 2017-08-10 MED FILL — HUMIRA PEN 40 MG/0.8ML PNKT: 40 | 28 days supply | Qty: 2 | Fill #0

## 2017-08-12 DIAGNOSIS — E7800 Pure hypercholesterolemia, unspecified: Secondary | ICD-10-CM | POA: Diagnosis not present

## 2017-08-17 MED FILL — ATORVASTATIN 20 MG TABLET: 20 | 90 days supply | Qty: 90 | Fill #0

## 2017-09-03 MED FILL — HUMIRA PEN 40 MG/0.8ML PNKT: 40 | 28 days supply | Qty: 2 | Fill #1

## 2017-09-06 MED FILL — LITHIUM ER 450 MG TABLET: 450 | 30 days supply | Qty: 60 | Fill #0

## 2017-09-10 DIAGNOSIS — R351 Nocturia: Secondary | ICD-10-CM | POA: Diagnosis not present

## 2017-09-10 DIAGNOSIS — N401 Enlarged prostate with lower urinary tract symptoms: Secondary | ICD-10-CM | POA: Diagnosis not present

## 2017-09-10 DIAGNOSIS — G2571 Drug induced akathisia: Secondary | ICD-10-CM | POA: Diagnosis not present

## 2017-09-10 MED FILL — TAMSULOSIN HCL 0.4 MG CAP: 0.4 | 30 days supply | Qty: 30 | Fill #0

## 2017-10-11 MED FILL — LITHIUM ER 450 MG TABLET: 450 | 30 days supply | Qty: 60 | Fill #1

## 2017-10-11 MED FILL — HUMIRA PEN 40 MG/0.8ML PNKT: 40 | 28 days supply | Qty: 2 | Fill #2

## 2017-10-13 DIAGNOSIS — F3132 Bipolar disorder, current episode depressed, moderate: Secondary | ICD-10-CM | POA: Diagnosis not present

## 2017-10-21 DIAGNOSIS — Z79899 Other long term (current) drug therapy: Secondary | ICD-10-CM | POA: Diagnosis not present

## 2017-10-21 DIAGNOSIS — F3176 Bipolar disorder, in full remission, most recent episode depressed: Secondary | ICD-10-CM | POA: Diagnosis not present

## 2017-10-26 DIAGNOSIS — R358 Other polyuria: Secondary | ICD-10-CM | POA: Diagnosis not present

## 2017-10-26 DIAGNOSIS — N401 Enlarged prostate with lower urinary tract symptoms: Secondary | ICD-10-CM | POA: Diagnosis not present

## 2017-10-26 DIAGNOSIS — R339 Retention of urine, unspecified: Secondary | ICD-10-CM | POA: Diagnosis not present

## 2017-10-26 DIAGNOSIS — N138 Other obstructive and reflux uropathy: Secondary | ICD-10-CM | POA: Diagnosis not present

## 2017-10-26 MED FILL — TAMSULOSIN HCL 0.4 MG CAP: 0.4 | 90 days supply | Qty: 90 | Fill #0

## 2017-10-27 MED FILL — QUETIAPINE FUMARATE 50 MG T: 50 | 30 days supply | Qty: 90 | Fill #3

## 2017-11-09 DIAGNOSIS — N138 Other obstructive and reflux uropathy: Secondary | ICD-10-CM | POA: Diagnosis not present

## 2017-11-09 DIAGNOSIS — N401 Enlarged prostate with lower urinary tract symptoms: Secondary | ICD-10-CM | POA: Diagnosis not present

## 2017-11-09 MED FILL — HUMIRA PEN 40 MG/0.8ML PNKT: 40 | 28 days supply | Qty: 2 | Fill #3

## 2017-11-18 DIAGNOSIS — R351 Nocturia: Secondary | ICD-10-CM | POA: Diagnosis not present

## 2017-11-18 DIAGNOSIS — N486 Induration penis plastica: Secondary | ICD-10-CM | POA: Diagnosis not present

## 2017-11-18 MED FILL — ATORVASTATIN CALCIUM 20 MG: 20 | 90 days supply | Qty: 90 | Fill #1

## 2017-11-18 MED FILL — LITHIUM ER 450 MG TABLET: 450 | 30 days supply | Qty: 45 | Fill #0

## 2017-11-23 MED FILL — ALFUZOSIN HCL ER 10 MG TAB: 10 | 30 days supply | Qty: 30 | Fill #0

## 2017-12-20 DIAGNOSIS — N486 Induration penis plastica: Secondary | ICD-10-CM | POA: Diagnosis not present

## 2017-12-20 DIAGNOSIS — R351 Nocturia: Secondary | ICD-10-CM | POA: Diagnosis not present

## 2017-12-20 MED FILL — LITHIUM ER 450 MG TABLET: 450 | 30 days supply | Qty: 45 | Fill #1

## 2017-12-20 MED FILL — ALFUZOSIN HCL ER 10 MG TAB: 10 | 30 days supply | Qty: 30 | Fill #1

## 2018-01-17 MED FILL — HUMIRA PEN 40 MG/0.8ML PNKT: 40 | 28 days supply | Qty: 2 | Fill #4

## 2018-01-17 MED FILL — LITHIUM ER 450 MG TABLET: 450 | 30 days supply | Qty: 45 | Fill #2

## 2018-01-17 MED FILL — ALFUZOSIN HCL ER 10 MG TAB: 10 | 30 days supply | Qty: 30 | Fill #2

## 2018-01-31 MED FILL — QUETIAPINE FUMARATE 50 MG T: 50 | 30 days supply | Qty: 90 | Fill #4

## 2018-02-15 MED FILL — ATORVASTATIN CALCIUM 20 MG: 20 | 90 days supply | Qty: 90 | Fill #2

## 2018-02-18 MED FILL — LITHIUM ER 450 MG TABLET: 450 | 30 days supply | Qty: 45 | Fill #3

## 2018-02-18 MED FILL — ALFUZOSIN HCL ER 10 MG TAB: 10 | 30 days supply | Qty: 30 | Fill #3

## 2018-02-21 MED FILL — HUMIRA PEN 40 MG/0.8ML PNKT: 40 | 28 days supply | Qty: 2 | Fill #5

## 2018-03-04 DIAGNOSIS — E039 Hypothyroidism, unspecified: Secondary | ICD-10-CM | POA: Diagnosis not present

## 2018-03-04 DIAGNOSIS — Z0000 Encounter for general adult medical examination without abnormal findings: Secondary | ICD-10-CM | POA: Diagnosis not present

## 2018-03-04 DIAGNOSIS — Z23 Encounter for immunization: Secondary | ICD-10-CM | POA: Diagnosis not present

## 2018-03-04 DIAGNOSIS — E7800 Pure hypercholesterolemia, unspecified: Secondary | ICD-10-CM | POA: Diagnosis not present

## 2018-03-04 DIAGNOSIS — Z131 Encounter for screening for diabetes mellitus: Secondary | ICD-10-CM | POA: Diagnosis not present

## 2018-03-12 ENCOUNTER — Encounter: Payer: Self-pay | Admitting: Emergency Medicine

## 2018-03-12 DIAGNOSIS — F319 Bipolar disorder, unspecified: Secondary | ICD-10-CM | POA: Insufficient documentation

## 2018-03-21 MED FILL — HUMIRA PEN 40 MG/0.8ML PNKT: 40 | 28 days supply | Qty: 2 | Fill #6

## 2018-03-22 MED FILL — LITHIUM ER 450 MG TABLET: 450 | 30 days supply | Qty: 45 | Fill #0

## 2018-03-22 MED FILL — ALFUZOSIN HCL ER 10 MG TAB: 10 | 90 days supply | Qty: 90 | Fill #0

## 2018-03-30 ENCOUNTER — Ambulatory Visit: Payer: 59 | Admitting: Psychiatry

## 2018-03-30 ENCOUNTER — Encounter: Payer: Self-pay | Admitting: Psychiatry

## 2018-03-30 DIAGNOSIS — F319 Bipolar disorder, unspecified: Secondary | ICD-10-CM

## 2018-03-30 DIAGNOSIS — Z79899 Other long term (current) drug therapy: Secondary | ICD-10-CM

## 2018-03-30 NOTE — Patient Instructions (Signed)
Get lithium blood test

## 2018-03-30 NOTE — Progress Notes (Signed)
Corey Reilly [id number] 12-12-1960 57 y.o.  Subjective:   Patient ID:  Corey Reilly is a 57 y.o. (DOB 03-12-1961) male.  Chief Complaint:  Chief Complaint  Patient presents with  . Follow-up    bipolar  . ADHD    HPI Corey Reilly presents to the office today for follow-up of bipolar disorder. Under a lot of stress for a couple of months.  Bipolar managed and under control.  Sleeping ok and may try to stop the Seroquel.  Plenty of sleep.  Patient reports stable mood and denies depressed or irritable moods.  Patient denies any recent difficulty with anxiety.  Patient denies difficulty with sleep initiation or maintenance. Denies appetite disturbance.  Patient reports that energy and motivation have been good.  Patient denies any difficulty with concentration.  Patient denies any suicidal ideation.  D doesn't drink bc pt has hx of alcohol dependence.  He's been sober for many years.  Attention problems.  Talked to wife.  Periods of poor function intermittently.  Gets distracted and then forgets.  Cannot control it.  Wonders if it's ADD.  D dx ADHD and the 2 of them are similar and she is on Adderall.  At [employer].  Constantly distracted.  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  Past psychiatric medication trials include Geodon which caused sedation, Zyprexa which caused sedation, Latuda which caused akathisia and Seroquel  Review of Systems:  Review of Systems  Neurological: Negative for tremors and weakness.  Psychiatric/Behavioral: Positive for decreased concentration. Negative for agitation, behavioral problems, confusion, dysphoric mood, hallucinations, self-injury, sleep disturbance and suicidal ideas. The patient is not nervous/anxious and is not hyperactive.     Medications: I have reviewed the patient's current medications.  Current Outpatient Medications  Medication Sig Dispense Refill  . Adalimumab (HUMIRA PEN) 40 MG/0.8ML PNKT Inject 40 mg into the  skin every 14 (fourteen) days. 2 each 10  . alfuzosin (UROXATRAL) 10 MG 24 hr tablet Take 10 mg by mouth daily with breakfast.    . lithium carbonate (ESKALITH) 450 MG CR tablet Take 675 mg by mouth daily.     . QUEtiapine (SEROQUEL) 50 MG tablet Take 50 mg by mouth at bedtime. 3 po qhs    . zolpidem (AMBIEN) 10 MG tablet Take 10 mg by mouth at bedtime.     No current facility-administered medications for this visit.     Medication Side Effects: None  Allergies: No Known Allergies  Past Medical History:  Diagnosis Date  . Depression   . Ulcer     History reviewed. No pertinent family history.  Social History   Socioeconomic History  . Marital status: Married    Spouse name: Not on file  . Number of children: Not on file  . Years of education: Not on file  . Highest education level: Not on file  Occupational History  . Not on file  Social Needs  . Financial resource strain: Not on file  . Food insecurity:    Worry: Not on file    Inability: Not on file  . Transportation needs:    Medical: Not on file    Non-medical: Not on file  Tobacco Use  . Smoking status: Never Smoker  . Smokeless tobacco: Never Used  Substance and Sexual Activity  . Alcohol use: Not on file  . Drug use: Not on file  . Sexual activity: Not on file  Lifestyle  . Physical activity:    Days per week: Not  on file    Minutes per session: Not on file  . Stress: Not on file  Relationships  . Social connections:    Talks on phone: Not on file    Gets together: Not on file    Attends religious service: Not on file    Active member of club or organization: Not on file    Attends meetings of clubs or organizations: Not on file    Relationship status: Not on file  . Intimate partner violence:    Fear of current or ex partner: Not on file    Emotionally abused: Not on file    Physically abused: Not on file    Forced sexual activity: Not on file  Other Topics Concern  . Not on file  Social  History Narrative  . Not on file    Past Medical History, Surgical history, Social history, and Family history were reviewed and updated as appropriate.   Please see review of systems for further details on the patient's review from today.   Objective:   Physical Exam:  There were no vitals taken for this visit.  Physical Exam  Constitutional: He is oriented to person, place, and time. He appears well-developed. No distress.  Musculoskeletal: He exhibits no deformity.  Neurological: He is alert and oriented to person, place, and time. He displays no tremor. Coordination and gait normal.  Psychiatric: He has a normal mood and affect. His speech is normal and behavior is normal. Judgment and thought content normal. His mood appears not anxious. His affect is not angry, not blunt, not labile and not inappropriate. Cognition and memory are normal. He does not exhibit a depressed mood. He expresses no homicidal and no suicidal ideation. He expresses no suicidal plans and no homicidal plans.  Insight intact. Complains of attention problems. No auditory or visual hallucinations. No delusions.  He is inattentive.    Lab Review:     Component Value Date/Time   NA 142 05/21/2011 1753   K 3.8 05/21/2011 1753   CL 95 (L) 05/21/2011 1753   CO2 16 (L) 05/21/2011 1753   GLUCOSE 176 (H) 05/21/2011 1753   BUN 12 05/21/2011 1753   CREATININE 1.08 05/21/2011 1753   CALCIUM 9.8 05/21/2011 1753   PROT 8.3 05/21/2011 1753   ALBUMIN 4.4 05/21/2011 1753   AST 48 (H) 05/21/2011 1753   ALT 37 05/21/2011 1753   ALKPHOS 87 05/21/2011 1753   BILITOT 2.5 (H) 05/21/2011 1753   GFRNONAA 79 (L) 05/21/2011 1753   GFRAA >90 05/21/2011 1753       Component Value Date/Time   WBC 19.2 (H) 05/21/2011 1753   RBC 4.91 05/21/2011 1753   HGB 16.7 05/21/2011 1753   HCT 46.1 05/21/2011 1753   PLT 216 05/21/2011 1753   MCV 93.9 05/21/2011 1753   MCH 34.0 05/21/2011 1753   MCHC 36.2 (H) 05/21/2011 1753   RDW  13.0 05/21/2011 1753   LYMPHSABS 0.8 05/21/2011 1753   MONOABS 1.2 (H) 05/21/2011 1753   EOSABS 0.0 05/21/2011 1753   BASOSABS 0.0 05/21/2011 1753   Last lithium level was October 21, 2017 0.4.  This was on 675 mg a day same his current dosage.  His level in November 18 was 1.10 900 mg a day.  The dosage was reduced because of elevated creatinine which in June was 1.53. No results found for: POCLITH, LITHIUM   No results found for: PHENYTOIN, PHENOBARB, VALPROATE, CBMZ   .res Assessment: Plan:  Bipolar I disorder ([place]) - Plan: Basic metabolic panel  Lithium use - Plan: Basic metabolic panel, Lithium level   RO ADD refer to Rachel Moulds MD. discussed the risk of that stimulant meds and other ADD meds can trigger mania.  Therefore I would like a more thorough evaluation of the possibility of ADD than is typically done and a general psychiatric office.  He agrees with that plan.  Administered ADD questionnaire today, but he forgot to turn it in.  Bipolar disorder under good control with lithium.  Check lithium levels and BMP.  Hx borderline creatinine. Counseled patient regarding potential benefits, risks, and side effects of lithium to include potential risk of lithium affecting thyroid and renal function.  Discussed need for periodic lab monitoring to determine drug level and to assess for potential adverse effects.  Counseled patient regarding signs and symptoms of lithium toxicity and advised that they notify office immediately or seek urgent medical attention if experiencing these signs and symptoms.  Patient advised to contact office with any questions or concerns.  This appt was 30 mins.  FU 6 mos or sooner pending Dr. Franklyn Lor consultation.  Lynder Parents, MD, DFAPA   Please see After Visit Summary for patient specific instructions.  Future Appointments  Date Time Provider Fate  09/29/2018  1:00 PM Cottle, Billey Co., MD [facility] None    Orders Placed This  Encounter  Procedures  . Basic metabolic panel  . Lithium level      -------------------------------

## 2018-04-06 DIAGNOSIS — F319 Bipolar disorder, unspecified: Secondary | ICD-10-CM | POA: Diagnosis not present

## 2018-04-06 DIAGNOSIS — Z79899 Other long term (current) drug therapy: Secondary | ICD-10-CM | POA: Diagnosis not present

## 2018-04-07 LAB — BASIC METABOLIC PANEL
BUN/Creatinine Ratio: 7 (calc) (ref 6–22)
BUN: 10 mg/dL (ref 7–25)
CALCIUM: 10.1 mg/dL (ref 8.6–10.3)
CHLORIDE: 104 mmol/L (ref 98–110)
CO2: 30 mmol/L (ref 20–32)
Creat: 1.41 mg/dL — ABNORMAL HIGH (ref 0.70–1.33)
Glucose, Bld: 86 mg/dL (ref 65–99)
POTASSIUM: 4.4 mmol/L (ref 3.5–5.3)
SODIUM: 143 mmol/L (ref 135–146)

## 2018-04-07 LAB — LITHIUM LEVEL: Lithium Lvl: 0.6 mmol/L (ref 0.6–1.2)

## 2018-04-21 MED FILL — LITHIUM ER 450 MG TABLET: 450 | 30 days supply | Qty: 45 | Fill #1

## 2018-05-02 MED FILL — HUMIRA PEN 40 MG/0.8ML PNKT: 40 | 28 days supply | Qty: 2 | Fill #7

## 2018-05-04 ENCOUNTER — Telehealth: Payer: Self-pay

## 2018-05-04 MED ORDER — QUETIAPINE FUMARATE 50 MG PO TABS: 150.0000 mg | ORAL_TABLET | Freq: Every day | ORAL | 5 refills | Status: DC

## 2018-05-04 MED FILL — QUETIAPINE FUMARATE 50 MG T: 50 | 30 days supply | Qty: 90 | Fill #0

## 2018-05-04 NOTE — Telephone Encounter (Signed)
Refill request from [facility] for quetiapine 50mg  3 q hs Next office visit 09/29/2018

## 2018-05-11 ENCOUNTER — Other Ambulatory Visit: Payer: Self-pay

## 2018-05-11 ENCOUNTER — Ambulatory Visit (INDEPENDENT_AMBULATORY_CARE_PROVIDER_SITE_OTHER): Payer: 59 | Admitting: Internal Medicine

## 2018-05-11 ENCOUNTER — Telehealth (HOSPITAL_COMMUNITY): Payer: Self-pay | Admitting: Emergency Medicine

## 2018-05-11 ENCOUNTER — Encounter: Payer: Self-pay | Admitting: Internal Medicine

## 2018-05-11 VITALS — BP 122/82 | HR 70 | Ht 72.0 in | Wt 191.0 lb

## 2018-05-11 DIAGNOSIS — Z01812 Encounter for preprocedural laboratory examination: Secondary | ICD-10-CM | POA: Diagnosis not present

## 2018-05-11 DIAGNOSIS — R079 Chest pain, unspecified: Secondary | ICD-10-CM

## 2018-05-11 LAB — PRO B NATRIURETIC PEPTIDE: NT-Pro BNP: 39 pg/mL (ref 0–210)

## 2018-05-11 MED ORDER — METOPROLOL TARTRATE 100 MG PO TABS: ORAL_TABLET | ORAL | 0 refills | Status: DC

## 2018-05-11 MED ORDER — ASPIRIN EC 81 MG PO TBEC: 81.0000 mg | DELAYED_RELEASE_TABLET | Freq: Every day | ORAL | 3 refills | Status: DC

## 2018-05-11 MED FILL — METOPROLOL TARTRATE 100 MG: 100 | 1 days supply | Qty: 1 | Fill #0

## 2018-05-11 NOTE — Patient Instructions (Addendum)
Medication Instructions:  Your physician has recommended you make the following change in your medication:  1. START Aspirin 81 mg once daily 2. YOU WILL TAKE METOPROLOL TARTRATE (LOPRESSOR) 100 MG TABLET 2 HOURS PRIOR TO CT TEST  * If you need a refill on your cardiac medications before your next appointment, please call your pharmacy.   Labwork: Today: BMP & Troponin *We will only notify you of abnormal results, otherwise continue current treatment plan.  Testing/Procedures: Your physician has requested that you have cardiac CT. Cardiac computed tomography (CT) is a painless test that uses an x-ray machine to take clear, detailed pictures of your heart. For further information please visit [email or web address]. Please follow instruction sheet as given.   Follow-Up: To be determined   Thank you for choosing [facility]!!    Any Other Special Instructions Will Be Listed Below (If Applicable).   Please arrive at the [facility] main entrance of [facility] at _______ AM (30-45 minutes prior to test start time)  [facility] [address] [place], [place] [address] [phone]  Proceed to the [facility] Radiology Department (First Floor).  Please follow these instructions carefully (unless otherwise directed):  Hold all erectile dysfunction medications at least 48 hours prior to test.  On the Night Before the Test: . Be sure to Drink plenty of water. . Do not consume any caffeinated/decaffeinated beverages or chocolate 12 hours prior to your test. . Do not take any antihistamines 12 hours prior to your test. . If you take Metformin do not take 24 hours prior to test.  On the Day of the Test: . Drink plenty of water. Do not drink any water within one hour of the test. . Do not eat any food 4 hours prior to the test. . You may take your regular medications prior to the test.  . Take metoprolol (Lopressor) two hours prior to test. . HOLD  Furosemide/Hydrochlorothiazide morning of the test.      After the Test: . Drink plenty of water. . After receiving IV contrast, you may experience a mild flushed feeling. This is normal. . On occasion, you may experience a mild rash up to 24 hours after the test. This is not dangerous. If this occurs, you can take Benadryl 25 mg and increase your fluid intake. . If you experience trouble breathing, this can be serious. If it is severe call 911 IMMEDIATELY. If it is mild, please call our office. . If you take any of these medications: Glipizide/Metformin, Avandament, Glucavance, please do not take 48 hours after completing test.     Cardiac CT Angiogram  A cardiac CT angiogram is a procedure to look at the heart and the area around the heart. It may be done to help find the cause of chest pains or other symptoms of heart disease. During this procedure, a large X-ray machine, called a CT scanner, takes detailed pictures of the heart and the surrounding area after a dye (contrast material) has been injected into blood vessels in the area. The procedure is also sometimes called a coronary CT angiogram, coronary artery scanning, or CTA. A cardiac CT angiogram allows the health care provider to see how well blood is flowing to and from the heart. The health care provider will be able to see if there are any problems, such as:  Blockage or narrowing of the coronary arteries in the heart.  Fluid around the heart.  Signs of weakness or disease in the muscles,  valves, and tissues of the heart. Tell a health care provider about:  Any allergies you have. This is especially important if you have had a previous allergic reaction to contrast dye.  All medicines you are taking, including vitamins, herbs, eye drops, creams, and over-the-counter medicines.  Any blood disorders you have.  Any surgeries you have had.  Any medical conditions you have.  Whether you are pregnant or may be  pregnant.  Any anxiety disorders, chronic pain, or other conditions you have that may increase your stress or prevent you from lying still. What are the risks? Generally, this is a safe procedure. However, problems may occur, including:  Bleeding.  Infection.  Allergic reactions to medicines or dyes.  Damage to other structures or organs.  Kidney damage from the dye or contrast that is used.  Increased risk of cancer from radiation exposure. This risk is low. Talk with your health care provider about: ? The risks and benefits of testing. ? How you can receive the lowest dose of radiation. What happens before the procedure?  Wear comfortable clothing and remove any jewelry, glasses, dentures, and hearing aids.  Follow instructions from your health care provider about eating and drinking. This may include: ? For 12 hours before the test - avoid caffeine. This includes tea, coffee, soda, energy drinks, and diet pills. Drink plenty of water or other fluids that do not have caffeine in them. Being well-hydrated can prevent complications. ? For 4-6 hours before the test - stop eating and drinking. The contrast dye can cause nausea, but this is less likely if your stomach is empty.  Ask your health care provider about changing or stopping your regular medicines. This is especially important if you are taking diabetes medicines, blood thinners, or medicines to treat erectile dysfunction. What happens during the procedure?  Hair on your chest may need to be removed so that small sticky patches called electrodes can be placed on your chest. These will transmit information that helps to monitor your heart during the test.  An IV tube will be inserted into one of your veins.  You might be given a medicine to control your heart rate during the test. This will help to ensure that good images are obtained.  You will be asked to lie on an exam table. This table will slide in and out of the CT  machine during the procedure.  Contrast dye will be injected into the IV tube. You might feel warm, or you may get a metallic taste in your mouth.  You will be given a medicine (nitroglycerin) to relax (dilate) the arteries in your heart.  The table that you are lying on will move into the CT machine tunnel for the scan.  The person running the machine will give you instructions while the scans are being done. You may be asked to: ? Keep your arms above your head. ? Hold your breath. ? Stay very still, even if the table is moving.  When the scanning is complete, you will be moved out of the machine.  The IV tube will be removed. The procedure may vary among health care providers and hospitals. What happens after the procedure?  You might feel warm, or you may get a metallic taste in your mouth from the contrast dye.  You may have a headache from the nitroglycerin.  After the procedure, drink water or other fluids to wash (flush) the contrast material out of your body.  Contact a health care  provider if you have any symptoms of allergy to the contrast. These symptoms include: ? Shortness of breath. ? Rash or hives. ? A racing heartbeat.  Most people can return to their normal activities right after the procedure. Ask your health care provider what activities are safe for you.  It is up to you to get the results of your procedure. Ask your health care provider, or the department that is doing the procedure, when your results will be ready. Summary  A cardiac CT angiogram is a procedure to look at the heart and the area around the heart. It may be done to help find the cause of chest pains or other symptoms of heart disease.  During this procedure, a large X-ray machine, called a CT scanner, takes detailed pictures of the heart and the surrounding area after a dye (contrast material) has been injected into blood vessels in the area.  Ask your health care provider about changing or  stopping your regular medicines before the procedure. This is especially important if you are taking diabetes medicines, blood thinners, or medicines to treat erectile dysfunction.  After the procedure, drink water or other fluids to wash (flush) the contrast material out of your body. This information is not intended to replace advice given to you by your health care provider. Make sure you discuss any questions you have with your health care provider. Document Released: 03/26/2008 Document Revised: 03/02/2016 Document Reviewed: 03/02/2016 Elsevier Interactive Patient Education  2019 [employer].

## 2018-05-11 NOTE — Telephone Encounter (Signed)
Reaching out to patient to offer assistance regarding upcoming cardiac imaging study; pt verbalizes understanding of appt date/time, parking situation and where to check in, pre-test NPO status and medications ordered, and verified current allergies; name and call back number provided for further questions should they arise Keighan Amezcua RN Navigator Cardiac Imaging [phone] 

## 2018-05-11 NOTE — Progress Notes (Signed)
ELECTROPHYSIOLOGY OFFICE NOTE  Patient ID: Corey Reilly, MRN: [id number], DOB/AGE: 1960/10/10 58 y.o. Admit date: (Not on file) Date of Consult: 05/11/2018  Primary Physician: Patient, No Pcp Per Primary Cardiologist:      Corey Reilly is a 58 y.o. male who is being seen today for the evaluation of chest pain at his own request.    HPI Corey Reilly is a 58 y.o. male with Psoriasis, GERD and bipolar disorder presenting with 2 week hx of chest pain.  Squeezing L chest without radiation, no associated epiphenomena.  Duration 15-20Hrs without aggravation by exertion  CRF neg for hypertension, smoking, diabetes + for psoraisis, and hyperlipidemia  Not fit of late  No Cocaine  GERD Sx were assoc with burning  Past Medical History:  Diagnosis Date  . Bipolar disorder ([place])   . Depression   . Psoriasis   . Ulcer       Surgical History: History reviewed. No pertinent surgical history.   Home Meds: No outpatient medications have been marked as taking for the 05/11/18 encounter (Office Visit) with Deboraha Sprang, MD.    Allergies: No Known Allergies  Social History   Socioeconomic History  . Marital status: Married    Spouse name: Not on file  . Number of children: Not on file  . Years of education: Not on file  . Highest education level: Not on file  Occupational History  . Not on file  Social Needs  . Financial resource strain: Not on file  . Food insecurity:    Worry: Not on file    Inability: Not on file  . Transportation needs:    Medical: Not on file    Non-medical: Not on file  Tobacco Use  . Smoking status: Never Smoker  . Smokeless tobacco: Never Used  Substance and Sexual Activity  . Alcohol use: Not on file  . Drug use: Not on file  . Sexual activity: Not on file  Lifestyle  . Physical activity:    Days per week: Not on file    Minutes per session: Not on file  . Stress: Not on file  Relationships  . Social connections:    Talks  on phone: Not on file    Gets together: Not on file    Attends religious service: Not on file    Active member of club or organization: Not on file    Attends meetings of clubs or organizations: Not on file    Relationship status: Not on file  . Intimate partner violence:    Fear of current or ex partner: Not on file    Emotionally abused: Not on file    Physically abused: Not on file    Forced sexual activity: Not on file  Other Topics Concern  . Not on file  Social History Narrative  . Not on file     Family History  Problem Relation Age of Onset  . Hyperlipidemia Father   . COPD Maternal Uncle      ROS:  Please see the history of present illness.     All other systems reviewed and negative.    Physical Exam: Blood pressure 122/82, pulse 70, height 6' (1.829 m), weight 191 lb (86.6 kg). General: Well developed, well nourished male in no acute distress. Head: Normocephalic, atraumatic, sclera non-icteric, no xanthomas, nares are without discharge. EENT: normal  Lymph Nodes:  none Neck: Negative for carotid bruits. JVD not elevated. Back:without scoliosis  kyphosis Lungs: Clear bilaterally to auscultation without wheezes, rales, or rhonchi. Breathing is unlabored. Heart: RRR with S1 S2. No murmur . No rubs, or gallops appreciated. Abdomen: Soft, non-tender, non-distended with normoactive bowel sounds. No hepatomegaly. No rebound/guarding. No obvious abdominal masses. Msk:  Strength and tone appear normal for age. Extremities: No clubbing or cyanosis. No edema.  Distal pedal pulses are 2+ and equal bilaterally. Skin: Warm and Dry Neuro: Alert and oriented X 3. CN III-XII intact Grossly normal sensory and motor function . Psych:  Responds to questions appropriately with a normal affect.      Labs: Cardiac Enzymes No results for input(s): CKTOTAL, CKMB, TROPONINI in the last 72 hours. CBC Lab Results  Component Value Date   WBC 19.2 (H) 05/21/2011   HGB 16.7 05/21/2011    HCT 46.1 05/21/2011   MCV 93.9 05/21/2011   PLT 216 05/21/2011   PROTIME: No results for input(s): LABPROT, INR in the last 72 hours. Chemistry No results for input(s): NA, K, CL, CO2, BUN, CREATININE, CALCIUM, PROT, BILITOT, ALKPHOS, ALT, AST, GLUCOSE in the last 168 hours.  Invalid input(s): LABALBU Lipids No results found for: CHOL, HDL, LDLCALC, TRIG BNP No results found for: PROBNP Thyroid Function Tests: No results for input(s): TSH, T4TOTAL, T3FREE, THYROIDAB in the last 72 hours.  Invalid input(s): FREET3 Miscellaneous No results found for: DDIMER  Radiology/Studies:  No results found.  EKG: sinus @ 70 18/09/39   Assessment and Plan:  Chest pain with typical and atypical features  Intermediate CV risk est from lipids 2018  Psoriasis  Bipolar disorder  Elevated Cr  With est GFR >75  Intermediate pretest likelihood in this 58 yo gentleman.  CaScoring not appropriate for adjudication of symptoms, hence, after discussion with Dr HS, have opted for prehydration and Cardiac CTA For now will begin ASA 81and check Cr today as well as Tn.  If abnormal, will proceed with cath.  If exertrionally provoked CP would, with long duration, have him go to ER and anticipate cath  His CaScore obtained at CTA can be used to adjudicate the need for statin therapy    Corey Reilly

## 2018-05-12 ENCOUNTER — Ambulatory Visit (HOSPITAL_COMMUNITY)
Admission: RE | Admit: 2018-05-12 | Discharge: 2018-05-12 | Disposition: A | Discharge status: Home / Self Care | Payer: 59 | Source: Ambulatory Visit | Attending: Internal Medicine | Admitting: Internal Medicine

## 2018-05-12 ENCOUNTER — Ambulatory Visit (HOSPITAL_COMMUNITY): Admission: RE | Admit: 2018-05-12 | Payer: 59 | Source: Ambulatory Visit

## 2018-05-12 ENCOUNTER — Encounter (HOSPITAL_COMMUNITY): Payer: Self-pay

## 2018-05-12 DIAGNOSIS — R079 Chest pain, unspecified: Secondary | ICD-10-CM | POA: Insufficient documentation

## 2018-05-12 MED ORDER — NITROGLYCERIN 0.4 MG SL SUBL
SUBLINGUAL_TABLET | SUBLINGUAL | Status: AC
  Filled 2018-05-12: qty 1

## 2018-05-12 MED ORDER — SODIUM CHLORIDE 0.9 % IV SOLN
INTRAVENOUS | Status: DC | PRN
  Administered 2018-05-12: 300 mL via INTRAVENOUS

## 2018-05-12 MED ORDER — IOPAMIDOL (ISOVUE-370) INJECTION 76%
100.0000 mL | Freq: Once | INTRAVENOUS | Status: AC | PRN
  Administered 2018-05-12: 100 mL via INTRAVENOUS

## 2018-05-12 MED ORDER — NITROGLYCERIN 0.4 MG SL SUBL
0.8000 mg | SUBLINGUAL_TABLET | Freq: Once | SUBLINGUAL | Status: AC
  Administered 2018-05-12: 0.8 mg via SUBLINGUAL
  Filled 2018-05-12: qty 25

## 2018-05-19 ENCOUNTER — Other Ambulatory Visit: Payer: Self-pay

## 2018-05-19 MED ORDER — LITHIUM CARBONATE ER 450 MG PO TBCR: 675.0000 mg | EXTENDED_RELEASE_TABLET | Freq: Every day | ORAL | 5 refills | Status: DC

## 2018-05-19 MED FILL — LITHIUM ER 450 MG TABLET: 450 | 30 days supply | Qty: 45 | Fill #0

## 2018-06-06 ENCOUNTER — Telehealth: Payer: Self-pay

## 2018-06-06 DIAGNOSIS — I272 Pulmonary hypertension, unspecified: Secondary | ICD-10-CM

## 2018-06-06 NOTE — Telephone Encounter (Signed)
Spoke with pt and he agrees to allow an Echo to be performed. He understands the echo scheduling dept will call him to arrange. He has no additional questions.

## 2018-06-06 NOTE — Telephone Encounter (Signed)
-----   Message from Deboraha Sprang, MD sent at 06/03/2018  4:47 PM EST ----- Can u arrange an echo for pulm HTN plez Thx S

## 2018-06-07 ENCOUNTER — Ambulatory Visit (HOSPITAL_COMMUNITY): Payer: 59 | Attending: Cardiology

## 2018-06-07 DIAGNOSIS — I272 Pulmonary hypertension, unspecified: Secondary | ICD-10-CM | POA: Insufficient documentation

## 2018-06-09 ENCOUNTER — Encounter: Payer: Self-pay | Admitting: Emergency Medicine

## 2018-06-20 MED FILL — ALFUZOSIN HCL ER 10 MG TAB: 10 | 90 days supply | Qty: 90 | Fill #1 | Status: TO

## 2018-06-20 MED FILL — LITHIUM ER 450 MG TABLET: 450 | 30 days supply | Qty: 45 | Fill #1 | Status: TO

## 2018-07-13 DIAGNOSIS — D485 Neoplasm of uncertain behavior of skin: Secondary | ICD-10-CM | POA: Diagnosis not present

## 2018-07-13 DIAGNOSIS — L400 Psoriasis vulgaris: Secondary | ICD-10-CM | POA: Diagnosis not present

## 2018-07-13 DIAGNOSIS — Z79899 Other long term (current) drug therapy: Secondary | ICD-10-CM | POA: Diagnosis not present

## 2018-07-13 DIAGNOSIS — L409 Psoriasis, unspecified: Secondary | ICD-10-CM | POA: Diagnosis not present

## 2018-07-19 MED FILL — LITHIUM ER 450 MG TABLET: 450 | 30 days supply | Qty: 45 | Fill #0

## 2018-07-19 MED FILL — ATORVASTATIN 20 MG TABLET: 20 | 30 days supply | Qty: 30 | Fill #0

## 2018-07-30 MED FILL — HUMIRA PEN 40 MG/0.8ML PNKT: 40 | 28 days supply | Qty: 2 | Fill #0

## 2018-08-01 ENCOUNTER — Ambulatory Visit (INDEPENDENT_AMBULATORY_CARE_PROVIDER_SITE_OTHER): Payer: 59 | Admitting: Pharmacist

## 2018-08-01 ENCOUNTER — Other Ambulatory Visit: Payer: Self-pay

## 2018-08-01 ENCOUNTER — Encounter: Payer: Self-pay | Admitting: Pharmacist

## 2018-08-01 DIAGNOSIS — Z79899 Other long term (current) drug therapy: Secondary | ICD-10-CM

## 2018-08-01 NOTE — Progress Notes (Signed)
S: Patient presents today to the [facility].  Patient is currently taking Humira for psoriasis. Patient is managed by Dr. Denna Haggard for this.   Efficacy: still continues to work well.  Adherence: denies any missed doses  Dosing:  Plaque psoriasis: SubQ: Maintenance: 40 mg every other week   Drug-drug interactions: none  Screening: TB test: completed per patient Hepatitis: completed  Monitoring: S/sx of infection: denies CBC: WNL per patient S/sx of hypersensitivity: denies S/sx of malignancy: denies S/sx of heart failure: denies     O:     Lab Results  Component Value Date   WBC 19.2 (H) 05/21/2011   HGB 16.7 05/21/2011   HCT 46.1 05/21/2011   MCV 93.9 05/21/2011   PLT 216 05/21/2011      Chemistry      Component Value Date/Time   NA 143 04/06/2018 1435   K 4.4 04/06/2018 1435   CL 104 04/06/2018 1435   CO2 30 04/06/2018 1435   BUN 10 04/06/2018 1435   CREATININE 1.41 (H) 04/06/2018 1435      Component Value Date/Time   CALCIUM 10.1 04/06/2018 1435   ALKPHOS 87 05/21/2011 1753   AST 48 (H) 05/21/2011 1753   ALT 37 05/21/2011 1753   BILITOT 2.5 (H) 05/21/2011 1753      A/P: 1. Medication review: Patient on Humira for psoriasis and continues to do well on it. Reviewed the medication with the patient, including the following: Humira is a TNF blocking agent indicated for ankylosing spondylitis, Crohn's disease, Hidradenitis suppurativa, psoriatic arthritis, plaque psoriasis, ulcerative colitis, and uveitis. The most common adverse effects are infections, headache, and injection site reactions. There is the possibility of an increased risk of malignancy but it is not well understood if this increased risk is due to there medication or the disease state. There are rare cases of pancytopenia and aplastic anemia. No recommendations for changes.   Christella Hartigan, PharmD, BCPS, BCACP, CPP Clinical Pharmacist  Practitioner  [phone]

## 2018-08-02 MED FILL — QUETIAPINE FUMARATE 50 MG T: 50 | 30 days supply | Qty: 90 | Fill #0

## 2018-08-15 MED FILL — ATORVASTATIN 20 MG TABLET: 20 | 30 days supply | Qty: 30 | Fill #0

## 2018-08-15 MED FILL — LITHIUM ER 450 MG TABLET: 450 | 30 days supply | Qty: 45 | Fill #1

## 2018-09-15 MED FILL — LITHIUM ER 450 MG TABLET: 450 | 30 days supply | Qty: 45 | Fill #2

## 2018-09-15 MED FILL — ATORVASTATIN 20 MG TABLET: 20 | 30 days supply | Qty: 30 | Fill #1

## 2018-09-15 MED FILL — ALFUZOSIN HCL ER 10 MG TAB: 10 | 90 days supply | Qty: 90 | Fill #0

## 2018-09-29 ENCOUNTER — Ambulatory Visit: Payer: 59 | Admitting: Psychiatry

## 2018-10-04 ENCOUNTER — Encounter: Payer: Self-pay | Admitting: Psychiatry

## 2018-10-04 ENCOUNTER — Other Ambulatory Visit: Payer: Self-pay

## 2018-10-04 ENCOUNTER — Ambulatory Visit (INDEPENDENT_AMBULATORY_CARE_PROVIDER_SITE_OTHER): Payer: 59 | Admitting: Psychiatry

## 2018-10-04 DIAGNOSIS — F319 Bipolar disorder, unspecified: Secondary | ICD-10-CM

## 2018-10-04 DIAGNOSIS — Z79899 Other long term (current) drug therapy: Secondary | ICD-10-CM | POA: Diagnosis not present

## 2018-10-04 NOTE — Patient Instructions (Addendum)
NAC 1000 mg daily for short-term memory  Adult Attention Deficit Hyperactivity Adult Self report scale

## 2018-10-04 NOTE — Progress Notes (Addendum)
Corey Reilly [id number] Sep 19, 1960 58 y.o.  Subjective:   Patient ID:  Corey Reilly is a 58 y.o. (DOB 06/03/1960) male.  Chief Complaint:  Chief Complaint  Patient presents with  . Follow-up    Medication Management  . Depression    Medication Management    Depression         Associated symptoms include decreased concentration.  Associated symptoms include no suicidal ideas.  KAEDEN Reilly presents to the office today for follow-up of bipolar disorder.  Last seen December 2019.  No meds were changed.  Labs were received with low normal lithium level.  Creatinine was slightly elevated at 1.4.  Considering doing as well as he can hope for.  CO forgetfulness.  Never dx with ADD.  Last 2 years it seems worse.  STM issues.    Under a lot of stress for a couple of months.  Bipolar managed and under control.  Sleeping ok and may try to stop the Seroquel.  Plenty of sleep.  Patient reports stable mood and denies depressed or irritable moods.  Patient denies any recent difficulty with anxiety.  Patient denies difficulty with sleep initiation or maintenance. Denies appetite disturbance.  Patient reports that energy and motivation have been good.  Patient denies any difficulty with concentration.  Patient denies any suicidal ideation.  D doesn't drink bc pt has hx of alcohol dependence.  He's been sober for many years.  Attention problems.  Talked to wife.  Periods of poor function intermittently.  Gets distracted and then forgets.  Cannot control it.  Wonders if it's ADD.  D dx ADHD and the 2 of them are similar and she is on Adderall.  At [employer].  Constantly distracted.  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  Past psychiatric medication trials include Geodon which caused sedation, Zyprexa which caused sedation, Latuda which caused akathisia and Seroquel Found out Aunt has bipolar disabled. Review of Systems:  Review of Systems  Neurological: Negative for tremors  and weakness.  Psychiatric/Behavioral: Positive for decreased concentration and depression. Negative for agitation, behavioral problems, confusion, dysphoric mood, hallucinations, self-injury, sleep disturbance and suicidal ideas. The patient is not nervous/anxious and is not hyperactive.     Medications: I have reviewed the patient's current medications.  Current Outpatient Medications  Medication Sig Dispense Refill  . Adalimumab (HUMIRA PEN) 40 MG/0.8ML PNKT Inject 40 mg into the skin every 14 (fourteen) days. 2 each 10  . alfuzosin (UROXATRAL) 10 MG 24 hr tablet Take 10 mg by mouth daily with breakfast.    . atorvastatin (LIPITOR) 20 MG tablet Take 10 mg by mouth daily.  11  . lithium carbonate (ESKALITH) 450 MG CR tablet Take 1.5 tablets (675 mg total) by mouth daily. 45 tablet 5  . QUEtiapine (SEROQUEL) 50 MG tablet Take 3 tablets (150 mg total) by mouth at bedtime. 3 po qhs (Patient taking differently: Take 50 mg by mouth at bedtime. ) 90 tablet 5   No current facility-administered medications for this visit.     Medication Side Effects: None  Allergies: No Known Allergies  Past Medical History:  Diagnosis Date  . Bipolar disorder ([place])   . Depression   . Psoriasis   . Ulcer     Family History  Problem Relation Age of Onset  . Hyperlipidemia Father   . COPD Maternal Uncle     Social History   Socioeconomic History  . Marital status: Married    Spouse name: Not on file  .  Number of children: Not on file  . Years of education: Not on file  . Highest education level: Not on file  Occupational History  . Not on file  Social Needs  . Financial resource strain: Not on file  . Food insecurity:    Worry: Not on file    Inability: Not on file  . Transportation needs:    Medical: Not on file    Non-medical: Not on file  Tobacco Use  . Smoking status: Never Smoker  . Smokeless tobacco: Never Used  Substance and Sexual Activity  . Alcohol use: Not on file  . Drug  use: Not on file  . Sexual activity: Not on file  Lifestyle  . Physical activity:    Days per week: Not on file    Minutes per session: Not on file  . Stress: Not on file  Relationships  . Social connections:    Talks on phone: Not on file    Gets together: Not on file    Attends religious service: Not on file    Active member of club or organization: Not on file    Attends meetings of clubs or organizations: Not on file    Relationship status: Not on file  . Intimate partner violence:    Fear of current or ex partner: Not on file    Emotionally abused: Not on file    Physically abused: Not on file    Forced sexual activity: Not on file  Other Topics Concern  . Not on file  Social History Narrative  . Not on file    Past Medical History, Surgical history, Social history, and Family history were reviewed and updated as appropriate.   Please see review of systems for further details on the patient's review from today.   Objective:   Physical Exam:  There were no vitals taken for this visit.  Physical Exam Constitutional:      General: He is not in acute distress.    Appearance: He is well-developed.  Musculoskeletal:        General: No deformity.  Neurological:     Mental Status: He is alert and oriented to person, place, and time.     Motor: No tremor.     Coordination: Coordination normal.     Gait: Gait normal.  Psychiatric:        Attention and Perception: He is inattentive.        Mood and Affect: Mood is not anxious or depressed. Affect is not labile, blunt, angry or inappropriate.        Speech: Speech normal.        Behavior: Behavior normal.        Thought Content: Thought content normal. Thought content does not include homicidal or suicidal ideation. Thought content does not include homicidal or suicidal plan.        Cognition and Memory: Cognition normal.        Judgment: Judgment normal.     Comments: Insight intact. Complains of attention problems and  poor STM. No auditory or visual hallucinations. No delusions.      Lab Review:     Component Value Date/Time   NA 143 04/06/2018 1435   K 4.4 04/06/2018 1435   CL 104 04/06/2018 1435   CO2 30 04/06/2018 1435   GLUCOSE 86 04/06/2018 1435   BUN 10 04/06/2018 1435   CREATININE 1.41 (H) 04/06/2018 1435   CALCIUM 10.1 04/06/2018 1435   PROT 8.3 05/21/2011 1753  ALBUMIN 4.4 05/21/2011 1753   AST 48 (H) 05/21/2011 1753   ALT 37 05/21/2011 1753   ALKPHOS 87 05/21/2011 1753   BILITOT 2.5 (H) 05/21/2011 1753   GFRNONAA 79 (L) 05/21/2011 1753   GFRAA >90 05/21/2011 1753       Component Value Date/Time   WBC 19.2 (H) 05/21/2011 1753   RBC 4.91 05/21/2011 1753   HGB 16.7 05/21/2011 1753   HCT 46.1 05/21/2011 1753   PLT 216 05/21/2011 1753   MCV 93.9 05/21/2011 1753   MCH 34.0 05/21/2011 1753   MCHC 36.2 (H) 05/21/2011 1753   RDW 13.0 05/21/2011 1753   LYMPHSABS 0.8 05/21/2011 1753   MONOABS 1.2 (H) 05/21/2011 1753   EOSABS 0.0 05/21/2011 1753   BASOSABS 0.0 05/21/2011 1753   Last lithium level was October 21, 2017 0.4.  This was on 675 mg a day same his current dosage.  His level in November 18 was 1.10 900 mg a day.  The dosage was reduced because of elevated creatinine which in June was 1.53. Lithium Lvl  Date Value Ref Range Status  04/06/2018 0.6 0.6 - 1.2 mmol/L Final     No results found for: PHENYTOIN, PHENOBARB, VALPROATE, CBMZ   Adult self-report scale for ADD score was 29 on inattentive side which is high and 23 on the hyperactive scale which is borderline high suggestive of ADHD  .res Assessment: Plan:    Bipolar I disorder ([place])  Lithium use    Bipolar disorder under good control with lithium.  Check lithium levels and BMP.  Hx borderline creatinine. Counseled patient regarding potential benefits, risks, and side effects of lithium to include potential risk of lithium affecting thyroid and renal function.  Discussed need for periodic lab monitoring to  determine drug level and to assess for potential adverse effects.  Counseled patient regarding signs and symptoms of lithium toxicity and advised that they notify office immediately or seek urgent medical attention if experiencing these signs and symptoms.  Patient advised to contact office with any questions or concerns.  NAC 1000 mg daily for short-term memory Consider eval and tx of ADD next visit.  He has a daughter with ADD.  Administer ADD scale today  This appt was 30 mins.  FU 2 mos   Lynder Parents, MD, DFAPA   Please see After Visit Summary for patient specific instructions.  Future Appointments  Date Time Provider [place]  12/07/2018  2:30 PM Cottle, Billey Co., MD [facility] None    No orders of the defined types were placed in this encounter.     -------------------------------

## 2018-10-18 MED FILL — ATORVASTATIN 20 MG TABLET: 20 | 30 days supply | Qty: 30 | Fill #2

## 2018-10-18 MED FILL — LITHIUM ER 450 MG TABLET: 450 | 30 days supply | Qty: 45 | Fill #3

## 2018-10-25 MED FILL — QUETIAPINE FUMARATE 50 MG T: 50 | 30 days supply | Qty: 90 | Fill #1

## 2018-11-15 ENCOUNTER — Other Ambulatory Visit: Payer: Self-pay | Admitting: Psychiatry

## 2018-11-15 MED FILL — LITHIUM ER 450 MG TABLET: 450 | 30 days supply | Qty: 45 | Fill #0

## 2018-11-15 MED FILL — ATORVASTATIN 20 MG TABLET: 20 | 30 days supply | Qty: 30 | Fill #3

## 2018-12-07 ENCOUNTER — Ambulatory Visit (INDEPENDENT_AMBULATORY_CARE_PROVIDER_SITE_OTHER): Payer: 59 | Admitting: Psychiatry

## 2018-12-07 ENCOUNTER — Other Ambulatory Visit: Payer: Self-pay

## 2018-12-07 ENCOUNTER — Encounter: Payer: Self-pay | Admitting: Psychiatry

## 2018-12-07 DIAGNOSIS — Z79899 Other long term (current) drug therapy: Secondary | ICD-10-CM

## 2018-12-07 DIAGNOSIS — F319 Bipolar disorder, unspecified: Secondary | ICD-10-CM | POA: Diagnosis not present

## 2018-12-07 DIAGNOSIS — F902 Attention-deficit hyperactivity disorder, combined type: Secondary | ICD-10-CM | POA: Diagnosis not present

## 2018-12-07 MED ORDER — LISDEXAMFETAMINE DIMESYLATE 30 MG PO CAPS: 30.0000 mg | ORAL_CAPSULE | Freq: Every day | ORAL | 0 refills | Status: DC

## 2018-12-07 MED FILL — VYVANSE 30 MG CAPSULE: 30 | 30 days supply | Qty: 30 | Fill #0

## 2018-12-07 NOTE — Progress Notes (Signed)
Corey Reilly [id number] [birth year] 58 y.o.  Subjective:   Patient ID:  Corey Reilly is a 58 y.o. (DOB 1961/04/25) male.  Chief Complaint:  No chief complaint on file.   Depression        Associated symptoms include decreased concentration.  Associated symptoms include no suicidal ideas.  Corey Reilly presents to the office today for follow-up of bipolar disorder and concerns about focus and possible ADD.  When seen December 2019.  No meds were changed.  Labs were received with low normal lithium level.  Creatinine was slightly elevated at 1.4.  Last seen in June.  No meds were changed.  Was recommended that he try NAC 600 mg daily for cognition.  Completed ADHD self questionnaire which was high on both the inattention and hyperactivity scales.  Tried NAC 1000 mg with less memory issues but not dramatic.  Does think it's improved.   Considering doing as well as he can hope for.  CO forgetfulness.  Never dx with ADD.  Last 2 years it seems worse.  STM issues.    Under a lot of stress for a couple of months.  Bipolar managed and under control.  Sleeping ok and may try to stop the Seroquel.  Plenty of sleep.  Patient reports stable mood and denies depressed or irritable moods.  Patient denies any recent difficulty with anxiety.  Patient denies difficulty with sleep initiation or maintenance. Denies appetite disturbance.  Patient reports that energy and motivation have been good.  Patient denies any difficulty with concentration.  Patient denies any suicidal ideation.  D doesn't drink bc pt has hx of alcohol dependence.  He's been sober for many years.  Attention problems.  Talked to wife.  Periods of poor function intermittently.  Gets distracted and then forgets.  Cannot control it.  Wonders if it's ADD.  D dx ADHD and the 2 of them are similar and she is on Adderall.  At [employer].  Constantly distracted.  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  Past psychiatric  medication trials include Geodon which caused sedation, Zyprexa which caused sedation, Latuda which caused akathisia and Seroquel Found out Aunt has bipolar disabled. Review of Systems:  Review of Systems  Neurological: Negative for tremors and weakness.  Psychiatric/Behavioral: Positive for decreased concentration and depression. Negative for agitation, behavioral problems, confusion, dysphoric mood, hallucinations, self-injury, sleep disturbance and suicidal ideas. The patient is not nervous/anxious and is not hyperactive.     Medications: I have reviewed the patient's current medications.  Current Outpatient Medications  Medication Sig Dispense Refill  . Adalimumab (HUMIRA PEN) 40 MG/0.8ML PNKT Inject 40 mg into the skin every 14 (fourteen) days. 2 each 10  . alfuzosin (UROXATRAL) 10 MG 24 hr tablet Take 10 mg by mouth daily with breakfast.    . atorvastatin (LIPITOR) 20 MG tablet Take 10 mg by mouth daily.  11  . lithium carbonate (ESKALITH) 450 MG CR tablet TAKE 1 & 1/2 TABLET BY MOUTH DAILY 45 tablet 3  . QUEtiapine (SEROQUEL) 50 MG tablet Take 3 tablets (150 mg total) by mouth at bedtime. 3 po qhs (Patient taking differently: Take 50 mg by mouth at bedtime. ) 90 tablet 5   No current facility-administered medications for this visit.     Medication Side Effects: None  Allergies: No Known Allergies  Past Medical History:  Diagnosis Date  . Bipolar disorder ([place])   . Depression   . Psoriasis   . Ulcer  Family History  Problem Relation Age of Onset  . Hyperlipidemia Father   . COPD Maternal Uncle     Social History   Socioeconomic History  . Marital status: Married    Spouse name: Not on file  . Number of children: Not on file  . Years of education: Not on file  . Highest education level: Not on file  Occupational History  . Not on file  Social Needs  . Financial resource strain: Not on file  . Food insecurity    Worry: Not on file    Inability: Not on file   . Transportation needs    Medical: Not on file    Non-medical: Not on file  Tobacco Use  . Smoking status: Never Smoker  . Smokeless tobacco: Never Used  Substance and Sexual Activity  . Alcohol use: Not on file  . Drug use: Not on file  . Sexual activity: Not on file  Lifestyle  . Physical activity    Days per week: Not on file    Minutes per session: Not on file  . Stress: Not on file  Relationships  . Social Herbalist on phone: Not on file    Gets together: Not on file    Attends religious service: Not on file    Active member of club or organization: Not on file    Attends meetings of clubs or organizations: Not on file    Relationship status: Not on file  . Intimate partner violence    Fear of current or ex partner: Not on file    Emotionally abused: Not on file    Physically abused: Not on file    Forced sexual activity: Not on file  Other Topics Concern  . Not on file  Social History Narrative  . Not on file    Past Medical History, Surgical history, Social history, and Family history were reviewed and updated as appropriate.   Please see review of systems for further details on the patient's review from today.   Objective:   Physical Exam:  There were no vitals taken for this visit.  Physical Exam Constitutional:      General: He is not in acute distress.    Appearance: He is well-developed.  Musculoskeletal:        General: No deformity.  Neurological:     Mental Status: He is alert and oriented to person, place, and time.     Motor: No tremor.     Coordination: Coordination normal.     Gait: Gait normal.  Psychiatric:        Attention and Perception: He is inattentive.        Mood and Affect: Mood is not anxious or depressed. Affect is not labile, blunt, angry or inappropriate.        Speech: Speech normal.        Behavior: Behavior normal.        Thought Content: Thought content normal. Thought content does not include homicidal or  suicidal ideation. Thought content does not include homicidal or suicidal plan.        Cognition and Memory: Cognition normal.        Judgment: Judgment normal.     Comments: Insight intact. Complains of attention problems and poor STM. No auditory or visual hallucinations. No delusions.      Lab Review:     Component Value Date/Time   NA 143 04/06/2018 1435   K 4.4 04/06/2018 1435  CL 104 04/06/2018 1435   CO2 30 04/06/2018 1435   GLUCOSE 86 04/06/2018 1435   BUN 10 04/06/2018 1435   CREATININE 1.41 (H) 04/06/2018 1435   CALCIUM 10.1 04/06/2018 1435   PROT 8.3 05/21/2011 1753   ALBUMIN 4.4 05/21/2011 1753   AST 48 (H) 05/21/2011 1753   ALT 37 05/21/2011 1753   ALKPHOS 87 05/21/2011 1753   BILITOT 2.5 (H) 05/21/2011 1753   GFRNONAA 79 (L) 05/21/2011 1753   GFRAA >90 05/21/2011 1753       Component Value Date/Time   WBC 19.2 (H) 05/21/2011 1753   RBC 4.91 05/21/2011 1753   HGB 16.7 05/21/2011 1753   HCT 46.1 05/21/2011 1753   PLT 216 05/21/2011 1753   MCV 93.9 05/21/2011 1753   MCH 34.0 05/21/2011 1753   MCHC 36.2 (H) 05/21/2011 1753   RDW 13.0 05/21/2011 1753   LYMPHSABS 0.8 05/21/2011 1753   MONOABS 1.2 (H) 05/21/2011 1753   EOSABS 0.0 05/21/2011 1753   BASOSABS 0.0 05/21/2011 1753   Last lithium level was October 21, 2017 0.4.  This was on 675 mg a day same his current dosage.  His level in November 18 was 1.10 900 mg a day.  The dosage was reduced because of elevated creatinine which in June was 1.53. Lithium Lvl  Date Value Ref Range Status  04/06/2018 0.6 0.6 - 1.2 mmol/L Final     No results found for: PHENYTOIN, PHENOBARB, VALPROATE, CBMZ   Adult self-report scale for ADD score was 29 on inattentive side which is high and 23 on the hyperactive scale which is borderline high suggestive of ADHD  .res Assessment: Plan:    Taiden was seen today for follow-up and other.  Diagnoses and all orders for this visit:  Attention deficit hyperactivity disorder  (ADHD), combined type  Bipolar I disorder ([place])  Lithium use   Bipolar disorder under good control with lithium.  Check lithium levels and BMP.  Hx borderline creatinine. Counseled patient regarding potential benefits, risks, and side effects of lithium to include potential risk of lithium affecting thyroid and renal function.  Discussed need for periodic lab monitoring to determine drug level and to assess for potential adverse effects.  Counseled patient regarding signs and symptoms of lithium toxicity and advised that they notify office immediately or seek urgent medical attention if experiencing these signs and symptoms.  Patient advised to contact office with any questions or concerns.  He is not manic and doing well with lithium  NAC 1000 mg daily for short-term memory Discussed his high score on the ADHD self questionnaire.  He has a daughter with ADD.  He feels this is interfering with his job function and quality of life and would like to try treatment.  We discussed stimulant and non-stimulant options for treatment of ADHD.  Discussed the risk that some of these treatments could trigger mania and a bipolar patient.  Discussed side effects in detail.  Discussed the differences between types of stimulants including methylphenidate and amphetamine based.  Also discussed the differences between short acting and long-acting stimulants and generally long-acting are preferred in adults.  Discussed potential addiction risk.  Discussed potential benefits, risks, and side effects of stimulants with patient to include increased heart rate, palpitations, insomnia, increased anxiety, increased irritability, or decreased appetite.  Instructed patient to contact office if experiencing any significant tolerability issues.  Vyvanse 30 mg daily. Trial.  We will proceed cautiously but may increase if there is no benefit at  the lower dosages.  This appt was 30 mins.  FU 2 mos   Lynder Parents, MD,  DFAPA   Please see After Visit Summary for patient specific instructions.  No future appointments.  No orders of the defined types were placed in this encounter.     -------------------------------

## 2018-12-15 MED FILL — LITHIUM ER 450 MG TABLET: 450 | 30 days supply | Qty: 45 | Fill #1

## 2018-12-15 MED FILL — ATORVASTATIN 20 MG TABLET: 20 | 30 days supply | Qty: 30 | Fill #4

## 2018-12-15 MED FILL — ALFUZOSIN HCL ER 10 MG TAB: 10 | 90 days supply | Qty: 90 | Fill #1

## 2018-12-29 ENCOUNTER — Telehealth: Payer: Self-pay | Admitting: Psychiatry

## 2018-12-29 ENCOUNTER — Other Ambulatory Visit: Payer: Self-pay

## 2018-12-29 DIAGNOSIS — F902 Attention-deficit hyperactivity disorder, combined type: Secondary | ICD-10-CM

## 2018-12-29 MED ORDER — LISDEXAMFETAMINE DIMESYLATE 30 MG PO CAPS: 30.0000 mg | ORAL_CAPSULE | Freq: Every day | ORAL | 0 refills | Status: DC

## 2018-12-29 NOTE — Telephone Encounter (Signed)
Last refill 08/12, pended for next refill

## 2018-12-29 NOTE — Telephone Encounter (Signed)
Pt stated he was prescribed Vyvanse that is working well. Per conversation with CC he would like to continue with the increase dosage recommended. Fill at the [facility].

## 2019-01-04 ENCOUNTER — Other Ambulatory Visit: Payer: Self-pay | Admitting: Physician Assistant

## 2019-01-04 ENCOUNTER — Other Ambulatory Visit: Payer: Self-pay

## 2019-01-04 DIAGNOSIS — F902 Attention-deficit hyperactivity disorder, combined type: Secondary | ICD-10-CM

## 2019-01-04 MED ORDER — LISDEXAMFETAMINE DIMESYLATE 40 MG PO CAPS: 40.0000 mg | ORAL_CAPSULE | Freq: Every day | ORAL | 0 refills | Status: DC

## 2019-01-04 MED FILL — VYVANSE 40 MG CAPSULE: 40 | 30 days supply | Qty: 30 | Fill #0

## 2019-01-13 MED FILL — LITHIUM ER 450 MG TABLET: 450 | 30 days supply | Qty: 45 | Fill #2

## 2019-01-13 MED FILL — ATORVASTATIN 20 MG TABLET: 20 | 30 days supply | Qty: 30 | Fill #5

## 2019-01-31 ENCOUNTER — Other Ambulatory Visit: Payer: Self-pay | Admitting: Physician Assistant

## 2019-01-31 DIAGNOSIS — F902 Attention-deficit hyperactivity disorder, combined type: Secondary | ICD-10-CM

## 2019-01-31 MED FILL — QUETIAPINE FUMARATE 50 MG T: 50 | 30 days supply | Qty: 90 | Fill #2

## 2019-01-31 NOTE — Telephone Encounter (Signed)
Pt has a 30 mg on file but he requested a 40 mg last month

## 2019-02-01 MED FILL — VYVANSE 40 MG CAPSULE: 40 | 30 days supply | Qty: 30 | Fill #0

## 2019-02-06 ENCOUNTER — Ambulatory Visit (INDEPENDENT_AMBULATORY_CARE_PROVIDER_SITE_OTHER): Payer: 59 | Admitting: Psychiatry

## 2019-02-06 ENCOUNTER — Other Ambulatory Visit: Payer: Self-pay

## 2019-02-06 ENCOUNTER — Encounter: Payer: Self-pay | Admitting: Psychiatry

## 2019-02-06 DIAGNOSIS — F902 Attention-deficit hyperactivity disorder, combined type: Secondary | ICD-10-CM

## 2019-02-06 MED ORDER — LISDEXAMFETAMINE DIMESYLATE 40 MG PO CAPS: 40.0000 mg | ORAL_CAPSULE | ORAL | 0 refills | Status: DC

## 2019-02-06 NOTE — Progress Notes (Signed)
[name] [id number] April 27, 1961 58 y.o.  Subjective:   Patient ID:  Corey Reilly is a 57 y.o. (DOB Oct 17, 1960) male.  Chief Complaint:  No chief complaint on file.   Depression        Associated symptoms include decreased concentration.  Associated symptoms include no suicidal ideas.  MUAAD KRAVCHENKO presents to the office today for follow-up of bipolar disorder and concerns about focus and possible ADD.  When seen December 2019.  No meds were changed.  Labs were received with low normal lithium level.  Creatinine was slightly elevated at 1.4.  When seen in June.  No meds were changed.  Was recommended that he try NAC 600 mg daily for cognition.  Completed ADHD self questionnaire which was high on both the inattention and hyperactivity scales.  Tried NAC 1000 mg with less memory issues but not dramatic.  Does think it's improved but not enough and wanted to pursue a trial of stimulants.  This was discussed with his wife who noted the following: Periods of poor function intermittently.  Gets distracted and then forgets.  Cannot control it.  Wonders if it's ADD.  D dx ADHD and the 2 of them are similar and she is on Adderall.  At [employer].  Constantly distracted.  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  Therefore at the last visit in August we initiated a trial of Vyvanse 30 mg daily. He called back September 3 asking for refills stating it was working well.  He did increase Vyvanse to 40 mg daily.  Doesn't feel the Vyvanse in any kick or mood but focus is definitely better.  Last month is best month ever and much mor productive and efficient.  Lost a little weight on it.  SE low appetite so skips on the weekend.  2 nights of no sleep but otherwise normal sleep. Has used more caffeine than usual but only in the morning.    Patient reports stable mood and denies depressed or irritable moods.  Patient denies any recent difficulty with anxiety.  Patient denies  difficulty with sleep initiation or maintenance. Denies appetite disturbance.  Patient reports that energy and motivation have been good.  Patient denies any difficulty with concentration.  Patient denies any suicidal ideation.  D doesn't drink bc pt has hx of alcohol dependence.  He's been sober for many years.  Attention problems.  Talked to wife.    Past psychiatric medication trials include Geodon which caused sedation, Zyprexa which caused sedation, Latuda which caused akathisia and Seroquel Found out Aunt has bipolar disabled.  Review of Systems:  Review of Systems  Neurological: Negative for tremors and weakness.  Psychiatric/Behavioral: Positive for decreased concentration and depression. Negative for agitation, behavioral problems, confusion, dysphoric mood, hallucinations, self-injury, sleep disturbance and suicidal ideas. The patient is not nervous/anxious and is not hyperactive.     Medications: I have reviewed the patient's current medications.  Current Outpatient Medications  Medication Sig Dispense Refill  . Adalimumab (HUMIRA PEN) 40 MG/0.8ML PNKT Inject 40 mg into the skin every 14 (fourteen) days. (Patient not taking: Reported on 12/07/2018) 2 each 10  . alfuzosin (UROXATRAL) 10 MG 24 hr tablet Take 10 mg by mouth daily with breakfast.    . atorvastatin (LIPITOR) 20 MG tablet Take 10 mg by mouth daily.  11  . lisdexamfetamine (VYVANSE) 30 MG capsule Take 1 capsule (30 mg total) by mouth daily. 30 capsule 0  . lithium carbonate (ESKALITH) 450 MG CR tablet TAKE  1 & 1/2 TABLET BY MOUTH DAILY 45 tablet 3  . QUEtiapine (SEROQUEL) 50 MG tablet Take 3 tablets (150 mg total) by mouth at bedtime. 3 po qhs (Patient taking differently: Take 50 mg by mouth at bedtime. ) 90 tablet 5  . VYVANSE 40 MG capsule TAKE 1 CAPSULE (40 MG TOTAL) BY MOUTH DAILY. 30 capsule 0   No current facility-administered medications for this visit.     Medication Side Effects: None  Allergies: No Known  Allergies  Past Medical History:  Diagnosis Date  . Bipolar disorder ([place])   . Depression   . Psoriasis   . Ulcer     Family History  Problem Relation Age of Onset  . Hyperlipidemia Father   . COPD Maternal Uncle     Social History   Socioeconomic History  . Marital status: Married    Spouse name: Not on file  . Number of children: Not on file  . Years of education: Not on file  . Highest education level: Not on file  Occupational History  . Not on file  Social Needs  . Financial resource strain: Not on file  . Food insecurity    Worry: Not on file    Inability: Not on file  . Transportation needs    Medical: Not on file    Non-medical: Not on file  Tobacco Use  . Smoking status: Never Smoker  . Smokeless tobacco: Never Used  Substance and Sexual Activity  . Alcohol use: Not on file  . Drug use: Not on file  . Sexual activity: Not on file  Lifestyle  . Physical activity    Days per week: Not on file    Minutes per session: Not on file  . Stress: Not on file  Relationships  . Social Herbalist on phone: Not on file    Gets together: Not on file    Attends religious service: Not on file    Active member of club or organization: Not on file    Attends meetings of clubs or organizations: Not on file    Relationship status: Not on file  . Intimate partner violence    Fear of current or ex partner: Not on file    Emotionally abused: Not on file    Physically abused: Not on file    Forced sexual activity: Not on file  Other Topics Concern  . Not on file  Social History Narrative  . Not on file    Past Medical History, Surgical history, Social history, and Family history were reviewed and updated as appropriate.   Please see review of systems for further details on the patient's review from today.   Objective:   Physical Exam:  There were no vitals taken for this visit.  Physical Exam Constitutional:      General: He is not in acute  distress.    Appearance: He is well-developed.  Musculoskeletal:        General: No deformity.  Neurological:     Mental Status: He is alert and oriented to person, place, and time.     Motor: No tremor.     Coordination: Coordination normal.     Gait: Gait normal.  Psychiatric:        Attention and Perception: He is inattentive.        Mood and Affect: Mood is not anxious or depressed. Affect is not labile, blunt, angry or inappropriate.  Speech: Speech normal.        Behavior: Behavior normal.        Thought Content: Thought content normal. Thought content does not include homicidal or suicidal ideation. Thought content does not include homicidal or suicidal plan.        Cognition and Memory: Cognition normal.        Judgment: Judgment normal.     Comments: Insight intact. Complains of attention problems and poor STM. No auditory or visual hallucinations. No delusions.      Lab Review:     Component Value Date/Time   NA 143 04/06/2018 1435   K 4.4 04/06/2018 1435   CL 104 04/06/2018 1435   CO2 30 04/06/2018 1435   GLUCOSE 86 04/06/2018 1435   BUN 10 04/06/2018 1435   CREATININE 1.41 (H) 04/06/2018 1435   CALCIUM 10.1 04/06/2018 1435   PROT 8.3 05/21/2011 1753   ALBUMIN 4.4 05/21/2011 1753   AST 48 (H) 05/21/2011 1753   ALT 37 05/21/2011 1753   ALKPHOS 87 05/21/2011 1753   BILITOT 2.5 (H) 05/21/2011 1753   GFRNONAA 79 (L) 05/21/2011 1753   GFRAA >90 05/21/2011 1753       Component Value Date/Time   WBC 19.2 (H) 05/21/2011 1753   RBC 4.91 05/21/2011 1753   HGB 16.7 05/21/2011 1753   HCT 46.1 05/21/2011 1753   PLT 216 05/21/2011 1753   MCV 93.9 05/21/2011 1753   MCH 34.0 05/21/2011 1753   MCHC 36.2 (H) 05/21/2011 1753   RDW 13.0 05/21/2011 1753   LYMPHSABS 0.8 05/21/2011 1753   MONOABS 1.2 (H) 05/21/2011 1753   EOSABS 0.0 05/21/2011 1753   BASOSABS 0.0 05/21/2011 1753   Last lithium level was October 21, 2017 0.4.  This was on 675 mg a day same his current  dosage.  His level in November 18 was 1.10 900 mg a day.  The dosage was reduced because of elevated creatinine which in June was 1.53. Lithium Lvl  Date Value Ref Range Status  04/06/2018 0.6 0.6 - 1.2 mmol/L Final     No results found for: PHENYTOIN, PHENOBARB, VALPROATE, CBMZ   Adult self-report scale for ADD score was 29 on inattentive side which is high and 23 on the hyperactive scale which is borderline high suggestive of ADHD  .res Assessment: Plan:    There are no diagnoses linked to this encounter. Bipolar disorder under good control with lithium.  Check lithium levels and BMP.  Hx borderline creatinine. Counseled patient regarding potential benefits, risks, and side effects of lithium to include potential risk of lithium affecting thyroid and renal function.  Discussed need for periodic lab monitoring to determine drug level and to assess for potential adverse effects.  Counseled patient regarding signs and symptoms of lithium toxicity and advised that they notify office immediately or seek urgent medical attention if experiencing these signs and symptoms.  Patient advised to contact office with any questions or concerns.  He is not manic and doing well with lithium  NAC 1000 mg daily for short-term memory Discussed his high score on the ADHD self questionnaire.  He has a daughter with ADD.  He feels this is interfering with his job function and quality of life and would like to try treatment.  We discussed stimulant and non-stimulant options for treatment of ADHD.  Discussed the risk that some of these treatments could trigger mania and a bipolar patient.  Discussed side effects in detail.  Discussed the differences between types  of stimulants including methylphenidate and amphetamine based.  Also discussed the differences between short acting and long-acting stimulants and generally long-acting are preferred in adults.  Discussed potential addiction risk.  Discussed potential benefits,  risks, and side effects of stimulants with patient to include increased heart rate, palpitations, insomnia, increased anxiety, increased irritability, or decreased appetite.  Instructed patient to contact office if experiencing any significant tolerability issues.  Vyvanse 30 mg daily. Trial.  We will proceed cautiously but may increase if there is no benefit at the lower dosages.  This appt was 30 mins.  FU 2 mos   Lynder Parents, MD, DFAPA   Please see After Visit Summary for patient specific instructions.  No future appointments.  No orders of the defined types were placed in this encounter.     -------------------------------

## 2019-02-11 MED FILL — LITHIUM ER 450 MG TABLET: 450 | 30 days supply | Qty: 45 | Fill #3

## 2019-02-13 MED FILL — ATORVASTATIN 20 MG TABLET: 20 | 90 days supply | Qty: 90 | Fill #0

## 2019-03-13 MED FILL — VYVANSE 40 MG CAPSULE: 40 | 30 days supply | Qty: 30 | Fill #0

## 2019-03-18 ENCOUNTER — Other Ambulatory Visit: Payer: Self-pay | Admitting: Psychiatry

## 2019-03-20 MED FILL — LITHIUM ER 450 MG TABLET: 450 | 30 days supply | Qty: 45 | Fill #0

## 2019-03-21 MED FILL — ALFUZOSIN HCL ER 10 MG TAB: 10 | 90 days supply | Qty: 90 | Fill #0

## 2019-03-27 DIAGNOSIS — E7800 Pure hypercholesterolemia, unspecified: Secondary | ICD-10-CM | POA: Diagnosis not present

## 2019-03-27 DIAGNOSIS — N401 Enlarged prostate with lower urinary tract symptoms: Secondary | ICD-10-CM | POA: Diagnosis not present

## 2019-03-27 DIAGNOSIS — E039 Hypothyroidism, unspecified: Secondary | ICD-10-CM | POA: Diagnosis not present

## 2019-03-27 DIAGNOSIS — Z0000 Encounter for general adult medical examination without abnormal findings: Secondary | ICD-10-CM | POA: Diagnosis not present

## 2019-04-06 DIAGNOSIS — Z131 Encounter for screening for diabetes mellitus: Secondary | ICD-10-CM | POA: Diagnosis not present

## 2019-04-06 DIAGNOSIS — Z13220 Encounter for screening for lipoid disorders: Secondary | ICD-10-CM | POA: Diagnosis not present

## 2019-04-06 DIAGNOSIS — Z0000 Encounter for general adult medical examination without abnormal findings: Secondary | ICD-10-CM | POA: Diagnosis not present

## 2019-04-06 DIAGNOSIS — Z125 Encounter for screening for malignant neoplasm of prostate: Secondary | ICD-10-CM | POA: Diagnosis not present

## 2019-04-18 MED FILL — VYVANSE 40 MG CAPSULE: 40 | 30 days supply | Qty: 30 | Fill #0

## 2019-04-18 MED FILL — LITHIUM ER 450 MG TABLET: 450 | 30 days supply | Qty: 45 | Fill #1

## 2019-05-03 MED FILL — QUETIAPINE FUMARATE 50 MG T: 50 | 30 days supply | Qty: 90 | Fill #3

## 2019-05-05 ENCOUNTER — Other Ambulatory Visit: Payer: Self-pay | Admitting: Pharmacist

## 2019-05-05 DIAGNOSIS — N486 Induration penis plastica: Secondary | ICD-10-CM | POA: Diagnosis not present

## 2019-05-05 DIAGNOSIS — R351 Nocturia: Secondary | ICD-10-CM | POA: Diagnosis not present

## 2019-05-05 MED ORDER — HUMIRA PEN 40 MG/0.8ML ~~LOC~~ PNKT: 40.0000 mg | PEN_INJECTOR | SUBCUTANEOUS | 10 refills | Status: DC

## 2019-05-08 MED FILL — HUMIRA PEN 40 MG/0.8ML PNKT: 40 | 28 days supply | Qty: 2 | Fill #0

## 2019-05-09 ENCOUNTER — Ambulatory Visit (INDEPENDENT_AMBULATORY_CARE_PROVIDER_SITE_OTHER): Payer: 59 | Admitting: Psychiatry

## 2019-05-09 ENCOUNTER — Encounter: Payer: Self-pay | Admitting: Psychiatry

## 2019-05-09 DIAGNOSIS — Z79899 Other long term (current) drug therapy: Secondary | ICD-10-CM | POA: Diagnosis not present

## 2019-05-09 DIAGNOSIS — F902 Attention-deficit hyperactivity disorder, combined type: Secondary | ICD-10-CM

## 2019-05-09 DIAGNOSIS — F319 Bipolar disorder, unspecified: Secondary | ICD-10-CM

## 2019-05-09 MED ORDER — LISDEXAMFETAMINE DIMESYLATE 40 MG PO CAPS: 40.0000 mg | ORAL_CAPSULE | ORAL | 0 refills | Status: DC

## 2019-05-09 NOTE — Progress Notes (Signed)
Corey Reilly [id number] July 09, 1960 59 y.o.  Subjective:   Patient ID:  Corey Reilly is a 59 y.o. (DOB 12-17-1960) male.  Chief Complaint:  Chief Complaint  Patient presents with  . Follow-up     Medication Management  . ADHD     Medication Management  . Other    Bipolar 1    Depression        Associated symptoms include decreased concentration.  Associated symptoms include no suicidal ideas.  Corey Reilly presents to the office today for follow-up of bipolar disorder and concerns about focus and possible ADD.  When seen December 2019.  No meds were changed.  Labs were received with low normal lithium level.  Creatinine was slightly elevated at 1.4.  When seen in June.  No meds were changed.  Was recommended that he try NAC 600 mg daily for cognition.  Completed ADHD self questionnaire which was high on both the inattention and hyperactivity scales.  Tried NAC 1000 mg with less memory issues but not dramatic.  Does think it's improved but not enough and wanted to pursue a trial of stimulants.  This was discussed with his wife who noted the following: Periods of poor function intermittently.  Gets distracted and then forgets.  Cannot control it.  Wonders if it's ADD.  D dx ADHD and the 2 of them are similar and she is on Adderall.  At [employer].  Constantly distracted.  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  Therefore at the visit in August we initiated a trial of Vyvanse 30 mg daily. He called back September 3 asking for refills stating it was working well.  He did increase Vyvanse to 40 mg daily.  Last seen February 06, 2019.  Rec trial NAC 1000 mg for STM and continue Vyvanse 30 as best tolerated dose.  No other med changes.  Overall doing OK.  Expected stress with Covid and politics.  Mental health is OK.  Doesn't feel the Vyvanse in any kick or mood but focus is definitely better.  Last month is best month ever and much mor productive and efficient.  Lost  a little weight on it.  SE low appetite so skips on the weekend.  Less distractible with Vyvanse and only uses work days.   2 nights of no sleep but otherwise normal sleep. Has used more caffeine than usual but only in the morning.    Patient reports stable mood and denies depressed or irritable moods.  Patient denies any recent difficulty with anxiety.  Patient denies difficulty with sleep initiation or maintenance. Denies appetite disturbance.  Patient reports that energy and motivation have been good.  Patient denies any difficulty with concentration.  Patient denies any suicidal ideation.  D doesn't drink bc pt has hx of alcohol dependence.  He's been sober for many years.  Attention problems.  Talked to wife.    Past psychiatric medication trials include Geodon which caused sedation, Zyprexa which caused sedation, Latuda which caused akathisia and Seroquel Found out Aunt has bipolar disabled.  Review of Systems:  Review of Systems  Neurological: Negative for dizziness, tremors and weakness.  Psychiatric/Behavioral: Positive for decreased concentration and depression. Negative for agitation, behavioral problems, confusion, dysphoric mood, hallucinations, self-injury, sleep disturbance and suicidal ideas. The patient is not nervous/anxious and is not hyperactive.     Medications: I have reviewed the patient's current medications.  Current Outpatient Medications  Medication Sig Dispense Refill  . alfuzosin (UROXATRAL) 10 MG 24 hr  tablet Take 10 mg by mouth daily with breakfast.    . atorvastatin (LIPITOR) 20 MG tablet Take 10 mg by mouth daily.  11  . lisdexamfetamine (VYVANSE) 40 MG capsule Take 1 capsule (40 mg total) by mouth every morning. 30 capsule 0  . [START ON 06/06/2019] lisdexamfetamine (VYVANSE) 40 MG capsule Take 1 capsule (40 mg total) by mouth every morning. 30 capsule 0  . [START ON 07/04/2019] lisdexamfetamine (VYVANSE) 40 MG capsule Take 1 capsule (40 mg total) by mouth  every morning. 30 capsule 0  . lithium carbonate (ESKALITH) 450 MG CR tablet TAKE 1 & 1/2 TABLET BY MOUTH DAILY 45 tablet 3  . QUEtiapine (SEROQUEL) 50 MG tablet Take 3 tablets (150 mg total) by mouth at bedtime. 3 po qhs (Patient taking differently: Take 50 mg by mouth at bedtime. ) 90 tablet 5  . Adalimumab (HUMIRA PEN) 40 MG/0.8ML PNKT Inject 40 mg into the skin every 14 (fourteen) days. (Patient not taking: Reported on 05/09/2019) 2 each 10   No current facility-administered medications for this visit.    Medication Side Effects: None  Allergies: No Known Allergies  Past Medical History:  Diagnosis Date  . Bipolar disorder ([place])   . Depression   . Psoriasis   . Ulcer     Family History  Problem Relation Age of Onset  . Hyperlipidemia Father   . COPD Maternal Uncle     Social History   Socioeconomic History  . Marital status: Married    Spouse name: Not on file  . Number of children: Not on file  . Years of education: Not on file  . Highest education level: Not on file  Occupational History  . Not on file  Tobacco Use  . Smoking status: Never Smoker  . Smokeless tobacco: Never Used  Substance and Sexual Activity  . Alcohol use: Not on file  . Drug use: Not on file  . Sexual activity: Not on file  Other Topics Concern  . Not on file  Social History Narrative  . Not on file   Social Determinants of Health   Financial Resource Strain:   . Difficulty of Paying Living Expenses: Not on file  Food Insecurity:   . Worried About Charity fundraiser in the Last Year: Not on file  . Ran Out of Food in the Last Year: Not on file  Transportation Needs:   . Lack of Transportation (Medical): Not on file  . Lack of Transportation (Non-Medical): Not on file  Physical Activity:   . Days of Exercise per Week: Not on file  . Minutes of Exercise per Session: Not on file  Stress:   . Feeling of Stress : Not on file  Social Connections:   . Frequency of Communication with  Friends and Family: Not on file  . Frequency of Social Gatherings with Friends and Family: Not on file  . Attends Religious Services: Not on file  . Active Member of Clubs or Organizations: Not on file  . Attends Archivist Meetings: Not on file  . Marital Status: Not on file  Intimate Partner Violence:   . Fear of Current or Ex-Partner: Not on file  . Emotionally Abused: Not on file  . Physically Abused: Not on file  . Sexually Abused: Not on file    Past Medical History, Surgical history, Social history, and Family history were reviewed and updated as appropriate.   Please see review of systems for further details on the  patient's review from today.   Objective:   Physical Exam:  There were no vitals taken for this visit.  Physical Exam Neurological:     Mental Status: He is alert and oriented to person, place, and time.     Cranial Nerves: No dysarthria.  Psychiatric:        Attention and Perception: Attention and perception normal.        Mood and Affect: Mood normal. Mood is not depressed or elated.        Speech: Speech normal.        Behavior: Behavior is cooperative.        Thought Content: Thought content normal. Thought content is not paranoid or delusional. Thought content does not include homicidal or suicidal ideation. Thought content does not include homicidal or suicidal plan.        Cognition and Memory: Cognition and memory normal.        Judgment: Judgment normal.     Comments: Insight intact     Lab Review:     Component Value Date/Time   NA 143 04/06/2018 1435   K 4.4 04/06/2018 1435   CL 104 04/06/2018 1435   CO2 30 04/06/2018 1435   GLUCOSE 86 04/06/2018 1435   BUN 10 04/06/2018 1435   CREATININE 1.41 (H) 04/06/2018 1435   CALCIUM 10.1 04/06/2018 1435   PROT 8.3 05/21/2011 1753   ALBUMIN 4.4 05/21/2011 1753   AST 48 (H) 05/21/2011 1753   ALT 37 05/21/2011 1753   ALKPHOS 87 05/21/2011 1753   BILITOT 2.5 (H) 05/21/2011 1753    GFRNONAA 79 (L) 05/21/2011 1753   GFRAA >90 05/21/2011 1753       Component Value Date/Time   WBC 19.2 (H) 05/21/2011 1753   RBC 4.91 05/21/2011 1753   HGB 16.7 05/21/2011 1753   HCT 46.1 05/21/2011 1753   PLT 216 05/21/2011 1753   MCV 93.9 05/21/2011 1753   MCH 34.0 05/21/2011 1753   MCHC 36.2 (H) 05/21/2011 1753   RDW 13.0 05/21/2011 1753   LYMPHSABS 0.8 05/21/2011 1753   MONOABS 1.2 (H) 05/21/2011 1753   EOSABS 0.0 05/21/2011 1753   BASOSABS 0.0 05/21/2011 1753   Last lithium level was October 21, 2017 0.4.  This was on 675 mg a day same his current dosage.  His level in November 18 was 1.10 900 mg a day.  The dosage was reduced because of elevated creatinine which in June was 1.53. Lithium Lvl  Date Value Ref Range Status  04/06/2018 0.6 0.6 - 1.2 mmol/L Final     No results found for: PHENYTOIN, PHENOBARB, VALPROATE, CBMZ   Adult self-report scale for ADD score was 29 on inattentive side which is high and 23 on the hyperactive scale which is borderline high suggestive of ADHD  .res Assessment: Plan:    Corey Reilly was seen today for follow-up, adhd and other.  Diagnoses and all orders for this visit:  Bipolar I disorder ([place]) -     Basic metabolic panel; Standing -     Lithium level; Standing -     TSH; Standing  Lithium use -     Basic metabolic panel; Standing -     Lithium level; Standing -     TSH; Standing  Attention deficit hyperactivity disorder (ADHD), combined type -     lisdexamfetamine (VYVANSE) 40 MG capsule; Take 1 capsule (40 mg total) by mouth every morning. -     lisdexamfetamine (VYVANSE) 40 MG capsule; Take 1  capsule (40 mg total) by mouth every morning. -     lisdexamfetamine (VYVANSE) 40 MG capsule; Take 1 capsule (40 mg total) by mouth every morning.   Bipolar disorder under good control with lithium.  Check lithium levels and BMP.  Hx borderline creatinine. Counseled patient regarding potential benefits, risks, and side effects of lithium to  include potential risk of lithium affecting thyroid and renal function.  Discussed need for periodic lab monitoring to determine drug level and to assess for potential adverse effects.  Counseled patient regarding signs and symptoms of lithium toxicity and advised that they notify office immediately or seek urgent medical attention if experiencing these signs and symptoms.  Patient advised to contact office with any questions or concerns.  Get labs ASAP.  Emphasized this.  He is not manic stable even with Vyvanse and doing well with lithium  Discussed his high score on the ADHD self questionnaire.  He has a daughter with ADD.  He feels this is interfering with his job function and quality of life and would like to try treatment.  We discussed stimulant and non-stimulant options for treatment of ADHD.  Discussed the risk that some of these treatments could trigger mania and a bipolar patient.  Discussed side effects in detail.  Discussed the differences between types of stimulants including methylphenidate and amphetamine based.  Also discussed the differences between short acting and long-acting stimulants and generally long-acting are preferred in adults.  Discussed this again at this visit because his daughter takes Adderall and he wanted to know the difference.  Discussed potential addiction risk.  Discussed potential benefits, risks, and side effects of stimulants with patient to include increased heart rate, palpitations, insomnia, increased anxiety, increased irritability, or decreased appetite.  Instructed patient to contact office if experiencing any significant tolerability issues.  Vyvanse 40 mg daily. Good responsel.  We will proceed cautiously but may increase if there is no benefit at the lower dosages.  He is only taking it on workdays and on heavy work days occasionally doubles up.  Discussed the risk with this because that is higher than the usual recommended dose.  However he is not taking it  at all on weekends or days that he is not working.  He will get labs.  Wife will remind him of this  This appt was 30 mins.  FU 3 mos   Lynder Parents, MD, DFAPA   Please see After Visit Summary for patient specific instructions.  Future Appointments  Date Time Provider [place]  08/07/2019  1:30 PM Cottle, Billey Co., MD [facility] None    Orders Placed This Encounter  Procedures  . Basic metabolic panel  . Lithium level  . TSH      -------------------------------

## 2019-05-11 MED FILL — ATORVASTATIN 20 MG TABLET: 20 | 90 days supply | Qty: 90 | Fill #0

## 2019-05-12 MED FILL — LITHIUM ER 450 MG TABLET: 450 | 30 days supply | Qty: 45 | Fill #2

## 2019-05-23 MED FILL — VYVANSE 40 MG CAPSULE: 40 | 30 days supply | Qty: 30 | Fill #0

## 2019-05-25 DIAGNOSIS — H524 Presbyopia: Secondary | ICD-10-CM | POA: Diagnosis not present

## 2019-05-25 DIAGNOSIS — H52223 Regular astigmatism, bilateral: Secondary | ICD-10-CM | POA: Diagnosis not present

## 2019-05-25 DIAGNOSIS — H5213 Myopia, bilateral: Secondary | ICD-10-CM | POA: Diagnosis not present

## 2019-05-26 MED FILL — ALFUZOSIN HCL ER 10 MG TB24: 10 | 90 days supply | Qty: 180 | Fill #0

## 2019-06-12 MED FILL — LITHIUM ER 450 MG TABLET: 450 | 30 days supply | Qty: 45 | Fill #3

## 2019-07-03 ENCOUNTER — Other Ambulatory Visit: Payer: Self-pay | Admitting: Psychiatry

## 2019-07-03 DIAGNOSIS — F3176 Bipolar disorder, in full remission, most recent episode depressed: Secondary | ICD-10-CM | POA: Diagnosis not present

## 2019-07-04 ENCOUNTER — Encounter: Payer: Self-pay | Admitting: Psychiatry

## 2019-07-04 LAB — LITHIUM LEVEL: Lithium Lvl: 0.6 mmol/L (ref 0.6–1.2)

## 2019-07-04 NOTE — Progress Notes (Signed)
lithium level 0.6.  Stable on 450 mg tablets 1-1/2 daily No change indicated

## 2019-07-05 ENCOUNTER — Other Ambulatory Visit: Payer: Self-pay | Admitting: Psychiatry

## 2019-07-05 DIAGNOSIS — F319 Bipolar disorder, unspecified: Secondary | ICD-10-CM

## 2019-07-05 DIAGNOSIS — Z79899 Other long term (current) drug therapy: Secondary | ICD-10-CM

## 2019-07-12 ENCOUNTER — Other Ambulatory Visit: Payer: Self-pay | Admitting: Psychiatry

## 2019-07-12 MED FILL — VYVANSE 40 MG CAPSULE: 40 | 30 days supply | Qty: 30 | Fill #0

## 2019-07-13 ENCOUNTER — Other Ambulatory Visit: Payer: Self-pay | Admitting: Psychiatry

## 2019-07-13 NOTE — Telephone Encounter (Signed)
Submitted 03/17

## 2019-07-14 ENCOUNTER — Other Ambulatory Visit: Payer: Self-pay | Admitting: Psychiatry

## 2019-07-14 MED FILL — LITHIUM ER 450 MG TABLET: 450 | 30 days supply | Qty: 45 | Fill #0

## 2019-07-25 ENCOUNTER — Telehealth: Payer: Self-pay | Admitting: Psychiatry

## 2019-07-25 NOTE — Telephone Encounter (Signed)
Corey Reilly returned the call.  He has recently increased his dose back to 900 daily.  He doesn't remember that he had problems at that dose before. However, he wasn't doing well so he increased.  He has had manic and depression symptoms.  He understands his lithium is low and knew he wasn't doing well and that is why he increased the dose.  If he needs to come in for visit he can, but he does need to know what to do from here.

## 2019-07-25 NOTE — Telephone Encounter (Signed)
Pt called to report blood work came back with low lithium level. Advise @ [phone]

## 2019-07-25 NOTE — Telephone Encounter (Signed)
Left patient message with Lithium level results and to call back with his dose and how he's doing.

## 2019-07-25 NOTE — Telephone Encounter (Signed)
Okay.  No change there until the patient was seen in about 2 weeks

## 2019-07-25 NOTE — Telephone Encounter (Signed)
His lithium level was 0.6 on 675 mg daily.  Please verify that that is the dose he is taking.  He was having side effects at 900 mg daily so we reduced to this dose.  This lithium level is in the low normal range and is acceptable as long as his symptoms remain under control.  Let us know if he is having any manic or depressive symptoms.

## 2019-07-26 NOTE — Telephone Encounter (Signed)
Left patient message to continue Lithium 900 mg until his next visit on 08/07/2019. If he has other questions or concerns please call back.

## 2019-07-27 ENCOUNTER — Telehealth: Payer: Self-pay | Admitting: *Deleted

## 2019-07-27 NOTE — Telephone Encounter (Signed)
Received Medication Refill for Rx Humira denied because patient needs office visit and Tb test.

## 2019-07-28 ENCOUNTER — Other Ambulatory Visit: Payer: Self-pay | Admitting: Psychiatry

## 2019-07-31 ENCOUNTER — Other Ambulatory Visit: Payer: Self-pay | Admitting: Psychiatry

## 2019-07-31 DIAGNOSIS — F319 Bipolar disorder, unspecified: Secondary | ICD-10-CM

## 2019-07-31 MED FILL — QUETIAPINE FUMARATE 50 MG T: 50 | 30 days supply | Qty: 90 | Fill #0

## 2019-07-31 MED FILL — AMOXICILLIN 500 MG CAPSULE: 500 | 7 days supply | Qty: 21 | Fill #0

## 2019-08-03 ENCOUNTER — Telehealth: Payer: Self-pay | Admitting: Pharmacist

## 2019-08-03 NOTE — Telephone Encounter (Signed)
Called patient to schedule an appointment for the [facility]. I was unable to reach the patient so I left a HIPAA-compliant message requesting that the patient return my call.   

## 2019-08-04 ENCOUNTER — Ambulatory Visit (HOSPITAL_BASED_OUTPATIENT_CLINIC_OR_DEPARTMENT_OTHER): Payer: 59 | Admitting: Pharmacist

## 2019-08-04 ENCOUNTER — Other Ambulatory Visit: Payer: Self-pay

## 2019-08-04 DIAGNOSIS — Z79899 Other long term (current) drug therapy: Secondary | ICD-10-CM

## 2019-08-04 NOTE — Progress Notes (Signed)
S: Patient presents today to the [facility].  Patient is currently taking Humira for psoriasis. Patient is managed by Dr. Denna Haggard for this.   Efficacy: still continues to work well.  Adherence: Recently restarted the Humira ~4-5 weeks ago after being vaccinated for covid.   Dosing:  Plaque psoriasis: SubQ: Maintenance: 40 mg every other week   Drug-drug interactions: none  Screening: TB test: completed per patient Hepatitis: completed  Monitoring: S/sx of infection: denies CBC: WNL per patient S/sx of hypersensitivity: denies S/sx of malignancy: denies S/sx of heart failure: denies  O:     Lab Results  Component Value Date   WBC 19.2 (H) 05/21/2011   HGB 16.7 05/21/2011   HCT 46.1 05/21/2011   MCV 93.9 05/21/2011   PLT 216 05/21/2011      Chemistry      Component Value Date/Time   NA 143 04/06/2018 1435   K 4.4 04/06/2018 1435   CL 104 04/06/2018 1435   CO2 30 04/06/2018 1435   BUN 10 04/06/2018 1435   CREATININE 1.41 (H) 04/06/2018 1435      Component Value Date/Time   CALCIUM 10.1 04/06/2018 1435   ALKPHOS 87 05/21/2011 1753   AST 48 (H) 05/21/2011 1753   ALT 37 05/21/2011 1753   BILITOT 2.5 (H) 05/21/2011 1753      A/P: 1. Medication review: Patient on Humira for psoriasis and continues to do well on it. Reviewed the medication with the patient, including the following: Humira is a TNF blocking agent indicated for ankylosing spondylitis, Crohn's disease, Hidradenitis suppurativa, psoriatic arthritis, plaque psoriasis, ulcerative colitis, and uveitis. The most common adverse effects are infections, headache, and injection site reactions. There is the possibility of an increased risk of malignancy but it is not well understood if this increased risk is due to there medication or the disease state. There are rare cases of pancytopenia and aplastic anemia. No recommendations for changes.   Benard Halsted,  PharmD, [place] [phone]

## 2019-08-07 ENCOUNTER — Encounter: Payer: Self-pay | Admitting: Psychiatry

## 2019-08-07 ENCOUNTER — Other Ambulatory Visit: Payer: Self-pay

## 2019-08-07 ENCOUNTER — Ambulatory Visit (INDEPENDENT_AMBULATORY_CARE_PROVIDER_SITE_OTHER): Payer: 59 | Admitting: Psychiatry

## 2019-08-07 DIAGNOSIS — F902 Attention-deficit hyperactivity disorder, combined type: Secondary | ICD-10-CM

## 2019-08-07 DIAGNOSIS — F319 Bipolar disorder, unspecified: Secondary | ICD-10-CM

## 2019-08-07 DIAGNOSIS — Z79899 Other long term (current) drug therapy: Secondary | ICD-10-CM

## 2019-08-07 MED FILL — AMOXICILLIN 500 MG CAPSULE: 500 | 7 days supply | Qty: 21 | Fill #1

## 2019-08-07 NOTE — Progress Notes (Signed)
NETHANEL STELTENPOHL MA:[id number] 07/16/1960 59 y.o.  Subjective:   Patient ID:  Corey Reilly is a 59 y.o. (DOB [birth year]) male.  Chief Complaint:  Chief Complaint  Patient presents with  . Manic Behavior  . Depression  . Follow-up    med changes    Depression        Associated symptoms include decreased concentration.  Associated symptoms include no suicidal ideas.  ZIGGY DICAPUA presents to the office today for follow-up of bipolar disorder and concerns about focus and possible ADD.  When seen December 2019.  No meds were changed.  Labs were received with low normal lithium level.  Creatinine was slightly elevated at 1.4.  When seen in June.  No meds were changed.  Was recommended that he try NAC 600 mg daily for cognition.  Completed ADHD self questionnaire which was high on both the inattention and hyperactivity scales.  Tried NAC 1000 mg with less memory issues but not dramatic.  Does think it's improved but not enough and wanted to pursue a trial of stimulants.  This was discussed with his wife who noted the following: Periods of poor function intermittently.  Gets distracted and then forgets.  Cannot control it.  Wonders if it's ADD.  D dx ADHD and the 2 of them are similar and she is on Adderall.  At [employer].  Constantly distracted.  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  Therefore at the visit in August we initiated a trial of Vyvanse 30 mg daily. He called back September 3 asking for refills stating it was working well.  He did increase Vyvanse to 40 mg daily.  seen February 06, 2019.  Rec trial NAC 1000 mg for STM and continue Vyvanse 30 as best tolerated dose.  No other med changes.  Overall doing OK.  Expected stress with Covid and politics.  Mental health is OK. Doesn't feel the Vyvanse in any kick or mood but focus is definitely better.  Last month is best month ever and much mor productive and efficient.  Lost a little weight on it.  SE low appetite so  skips on the weekend.  Less distractible with Vyvanse and only uses work days.    Last seen May 09, 2019.  No meds were changed.  He increased the lithium dose back to 900 mg daily about first of April, because he was having some manic and depressive symptoms.  Lithium level requested was 0.6 at lower dosage of 675 mg daily.. Mood the last few days is OK.  Dealing with some pain in TMJ that is affecting him.  2 nights of no sleep but otherwise normal sleep. Has used more caffeine than usual but only in the morning.    Patient reports stable mood and denies depressed or irritable moods.  Patient denies any recent difficulty with anxiety.  Patient denies difficulty with sleep initiation or maintenance. Denies appetite disturbance.  Patient reports that energy and motivation have been good.  Patient denies any difficulty with concentration.  Patient denies any suicidal ideation.  D doesn't drink bc pt has hx of alcohol dependence.  He's been sober for many years.  Attention problems.  Talked to wife.    Past psychiatric medication trials include Geodon which caused sedation, Zyprexa which caused sedation, Latuda which caused akathisia and Seroquel Found out Aunt has bipolar disabled.  Review of Systems:  Review of Systems  HENT: Positive for dental problem.   Neurological: Negative for dizziness, tremors and weakness.  Psychiatric/Behavioral: Positive for decreased concentration and depression. Negative for agitation, behavioral problems, confusion, dysphoric mood, hallucinations, self-injury, sleep disturbance and suicidal ideas. The patient is not nervous/anxious and is not hyperactive.     Medications: I have reviewed the patient's current medications.  Current Outpatient Medications  Medication Sig Dispense Refill  . Adalimumab (HUMIRA PEN) 40 MG/0.8ML PNKT Inject 40 mg into the skin every 14 (fourteen) days. 2 each 10  . alfuzosin (UROXATRAL) 10 MG 24 hr tablet Take 10 mg by mouth  daily with breakfast.    . atorvastatin (LIPITOR) 20 MG tablet Take 10 mg by mouth daily.  11  . lisdexamfetamine (VYVANSE) 40 MG capsule Take 1 capsule (40 mg total) by mouth every morning. 30 capsule 0  . lisdexamfetamine (VYVANSE) 40 MG capsule Take 1 capsule (40 mg total) by mouth every morning. 30 capsule 0  . lisdexamfetamine (VYVANSE) 40 MG capsule Take 1 capsule (40 mg total) by mouth every morning. 30 capsule 0  . lithium carbonate (ESKALITH) 450 MG CR tablet TAKE 1 & 1/2 TABLET BY MOUTH DAILY 45 tablet 3  . QUEtiapine (SEROQUEL) 50 MG tablet TAKE 3 TABLETS BY MOUTH AT BEDTIME 90 tablet 4   No current facility-administered medications for this visit.    Medication Side Effects: None  Allergies: No Known Allergies  Past Medical History:  Diagnosis Date  . Bipolar disorder ([place])   . Depression   . Psoriasis   . Ulcer     Family History  Problem Relation Age of Onset  . Hyperlipidemia Father   . COPD Maternal Uncle     Social History   Socioeconomic History  . Marital status: Married    Spouse name: Not on file  . Number of children: Not on file  . Years of education: Not on file  . Highest education level: Not on file  Occupational History  . Not on file  Tobacco Use  . Smoking status: Never Smoker  . Smokeless tobacco: Never Used  Substance and Sexual Activity  . Alcohol use: Not on file  . Drug use: Not on file  . Sexual activity: Not on file  Other Topics Concern  . Not on file  Social History Narrative  . Not on file   Social Determinants of Health   Financial Resource Strain:   . Difficulty of Paying Living Expenses:   Food Insecurity:   . Worried About Charity fundraiser in the Last Year:   . Arboriculturist in the Last Year:   Transportation Needs:   . Film/video editor (Medical):   Marland Kitchen Lack of Transportation (Non-Medical):   Physical Activity:   . Days of Exercise per Week:   . Minutes of Exercise per Session:   Stress:   . Feeling  of Stress :   Social Connections:   . Frequency of Communication with Friends and Family:   . Frequency of Social Gatherings with Friends and Family:   . Attends Religious Services:   . Active Member of Clubs or Organizations:   . Attends Archivist Meetings:   Marland Kitchen Marital Status:   Intimate Partner Violence:   . Fear of Current or Ex-Partner:   . Emotionally Abused:   Marland Kitchen Physically Abused:   . Sexually Abused:     Past Medical History, Surgical history, Social history, and Family history were reviewed and updated as appropriate.   Please see review of systems for further details on the patient's review from today.  Objective:   Physical Exam:  There were no vitals taken for this visit.  Physical Exam Neurological:     Mental Status: He is alert and oriented to person, place, and time.     Cranial Nerves: No dysarthria.  Psychiatric:        Attention and Perception: Attention and perception normal.        Mood and Affect: Mood normal. Mood is not depressed or elated.        Speech: Speech normal.        Behavior: Behavior is cooperative.        Thought Content: Thought content normal. Thought content is not paranoid or delusional. Thought content does not include homicidal or suicidal ideation. Thought content does not include homicidal or suicidal plan.        Cognition and Memory: Cognition and memory normal.        Judgment: Judgment normal.     Comments: Insight intact     Lab Review:     Component Value Date/Time   NA 143 04/06/2018 1435   K 4.4 04/06/2018 1435   CL 104 04/06/2018 1435   CO2 30 04/06/2018 1435   GLUCOSE 86 04/06/2018 1435   BUN 10 04/06/2018 1435   CREATININE 1.41 (H) 04/06/2018 1435   CALCIUM 10.1 04/06/2018 1435   PROT 8.3 05/21/2011 1753   ALBUMIN 4.4 05/21/2011 1753   AST 48 (H) 05/21/2011 1753   ALT 37 05/21/2011 1753   ALKPHOS 87 05/21/2011 1753   BILITOT 2.5 (H) 05/21/2011 1753   GFRNONAA 79 (L) 05/21/2011 1753   GFRAA >90  05/21/2011 1753       Component Value Date/Time   WBC 19.2 (H) 05/21/2011 1753   RBC 4.91 05/21/2011 1753   HGB 16.7 05/21/2011 1753   HCT 46.1 05/21/2011 1753   PLT 216 05/21/2011 1753   MCV 93.9 05/21/2011 1753   MCH 34.0 05/21/2011 1753   MCHC 36.2 (H) 05/21/2011 1753   RDW 13.0 05/21/2011 1753   LYMPHSABS 0.8 05/21/2011 1753   MONOABS 1.2 (H) 05/21/2011 1753   EOSABS 0.0 05/21/2011 1753   BASOSABS 0.0 05/21/2011 1753   Last lithium level was October 21, 2017 0.4.  This was on 675 mg a day same his current dosage.  His level in November 18 was 1.10 900 mg a day.  The dosage was reduced because of elevated creatinine which in June was 1.53. Lithium Lvl  Date Value Ref Range Status  07/03/2019 0.6 0.6 - 1.2 mmol/L Final   This was on 675 mg lithium daily.  No results found for: PHENYTOIN, PHENOBARB, VALPROATE, CBMZ   Adult self-report scale for ADD score was 29 on inattentive side which is high and 23 on the hyperactive scale which is borderline high suggestive of ADHD  .res Assessment: Plan:    Shahil was seen today for manic behavior, depression and follow-up.  Diagnoses and all orders for this visit:  Bipolar I disorder ([place]) -     Basic metabolic panel -     Lithium level -     TSH  Lithium use -     Basic metabolic panel -     Lithium level -     TSH  Attention deficit hyperactivity disorder (ADHD), combined type   Bipolar disorder under good control with lithium.  Check lithium levels and BMP.  Hx borderline creatinine. Counseled patient regarding potential benefits, risks, and side effects of lithium to include potential risk of lithium affecting  thyroid and renal function.  Discussed need for periodic lab monitoring to determine drug level and to assess for potential adverse effects.  Counseled patient regarding signs and symptoms of lithium toxicity and advised that they notify office immediately or seek urgent medical attention if experiencing these signs and  symptoms.  Patient advised to contact office with any questions or concerns.  Get labs ASAP.  Emphasized this.  He is not manic and no longer depressed after increase lithium to 900 mg daily.  stable even with Vyvanse and doing well with lithium  Discussed his high score on the ADHD self questionnaire.  He has a daughter with ADD.  He feels this is interfering with his job function and quality of life and would like to try treatment.  We discussed stimulant and non-stimulant options for treatment of ADHD.  Discussed the risk that some of these treatments could trigger mania and a bipolar patient.  Discussed side effects in detail.  Discussed the differences between types of stimulants including methylphenidate and amphetamine based.  Also discussed the differences between short acting and long-acting stimulants and generally long-acting are preferred in adults.  Discussed this again at this visit because his daughter takes Adderall and he wanted to know the difference.  Discussed potential addiction risk.  Discussed potential benefits, risks, and side effects of stimulants with patient to include increased heart rate, palpitations, insomnia, increased anxiety, increased irritability, or decreased appetite.  Instructed patient to contact office if experiencing any significant tolerability issues.  Vyvanse 40 mg daily. Good responsel.  We will proceed cautiously but may increase if there is no benefit at the lower dosages.  He is only taking it on workdays and on heavy work days occasionally doubles up.  Discussed the risk with this because that is higher than the usual recommended dose.  However he is not taking it at all on weekends or days that he is not working.  He will get labs.  Wife will remind him of this.  Disc kidney function test needs repeating.  This appt was 30 mins.  FU 3 mos   Lynder Parents, MD, DFAPA   Please see After Visit Summary for patient specific instructions.  No future  appointments.  Orders Placed This Encounter  Procedures  . Basic metabolic panel  . Lithium level  . TSH      -------------------------------

## 2019-08-08 MED FILL — LITHIUM ER 450 MG TABLET: 450 | 30 days supply | Qty: 45 | Fill #1

## 2019-08-09 DIAGNOSIS — F319 Bipolar disorder, unspecified: Secondary | ICD-10-CM | POA: Diagnosis not present

## 2019-08-09 DIAGNOSIS — Z79899 Other long term (current) drug therapy: Secondary | ICD-10-CM | POA: Diagnosis not present

## 2019-08-09 MED FILL — ATORVASTATIN 20 MG TABLET: 20 | 90 days supply | Qty: 90 | Fill #1

## 2019-08-10 LAB — BASIC METABOLIC PANEL
BUN/Creatinine Ratio: 7 (calc) (ref 6–22)
BUN: 10 mg/dL (ref 7–25)
CO2: 26 mmol/L (ref 20–32)
Calcium: 9.4 mg/dL (ref 8.6–10.3)
Chloride: 107 mmol/L (ref 98–110)
Creat: 1.39 mg/dL — ABNORMAL HIGH (ref 0.70–1.33)
Glucose, Bld: 96 mg/dL (ref 65–99)
Potassium: 4 mmol/L (ref 3.5–5.3)
Sodium: 141 mmol/L (ref 135–146)

## 2019-08-10 LAB — TSH: TSH: 1.68 mIU/L (ref 0.40–4.50)

## 2019-08-10 LAB — LITHIUM LEVEL: Lithium Lvl: 1 mmol/L (ref 0.6–1.2)

## 2019-08-16 MED FILL — VYVANSE 40 MG CAPSULE: 40 | 30 days supply | Qty: 30 | Fill #0

## 2019-08-21 MED FILL — ALFUZOSIN HCL ER 10 MG TB24: 10 | 90 days supply | Qty: 180 | Fill #1

## 2019-08-28 ENCOUNTER — Telehealth: Payer: Self-pay | Admitting: Dermatology

## 2019-08-28 NOTE — Telephone Encounter (Signed)
Patient needs refill Humira: denied patient needs Office visit and labs. -office visit made May 24

## 2019-08-28 NOTE — Telephone Encounter (Signed)
Patient is calling to get refill on Humira.  Corey Reilly stopped Humira when Pandemic started and now has restarted.  Corey Reilly had 1 refill left and so that is how he restarted.  Now [facility] is telling him that he needs a prior authorization to get more refills.  What should he do?  Chart # [id number].

## 2019-09-01 MED FILL — LITHIUM ER 450 MG TABLET: 450 | 30 days supply | Qty: 45 | Fill #2

## 2019-09-14 ENCOUNTER — Telehealth: Payer: Self-pay

## 2019-09-14 ENCOUNTER — Other Ambulatory Visit: Payer: Self-pay | Admitting: Psychiatry

## 2019-09-14 DIAGNOSIS — F902 Attention-deficit hyperactivity disorder, combined type: Secondary | ICD-10-CM

## 2019-09-14 NOTE — Telephone Encounter (Signed)
Can you please resend this?

## 2019-09-14 NOTE — Telephone Encounter (Signed)
Last apt 04/12

## 2019-09-14 NOTE — Telephone Encounter (Signed)
Fax from [facility] regarding Prior Authorization for Humira wanting to know if the patient has achieved or maintained clear or minimal disease or a decrease in PASI (Psoriasis Area and Severity Index) of at least 50% or more while on therapy?  Response faxed back to [facility] @ [phone] letting them know patient has seen improvement.

## 2019-09-15 MED FILL — VYVANSE 40 MG CAPSULE: 40 | 30 days supply | Qty: 30 | Fill #0

## 2019-09-18 ENCOUNTER — Ambulatory Visit: Payer: 59 | Admitting: Dermatology

## 2019-09-18 MED FILL — HUMIRA 40 MG/0.8ML PSKT: 40 | 28 days supply | Qty: 2 | Fill #0 | Status: TO

## 2019-09-20 ENCOUNTER — Encounter: Payer: Self-pay | Admitting: Dermatology

## 2019-09-20 ENCOUNTER — Ambulatory Visit: Payer: 59 | Admitting: Dermatology

## 2019-09-20 ENCOUNTER — Other Ambulatory Visit: Payer: Self-pay

## 2019-09-20 DIAGNOSIS — L400 Psoriasis vulgaris: Secondary | ICD-10-CM

## 2019-09-20 DIAGNOSIS — L409 Psoriasis, unspecified: Secondary | ICD-10-CM | POA: Diagnosis not present

## 2019-09-20 DIAGNOSIS — D225 Melanocytic nevi of trunk: Secondary | ICD-10-CM

## 2019-09-20 DIAGNOSIS — D229 Melanocytic nevi, unspecified: Secondary | ICD-10-CM

## 2019-09-20 DIAGNOSIS — Z1283 Encounter for screening for malignant neoplasm of skin: Secondary | ICD-10-CM

## 2019-09-20 NOTE — Progress Notes (Signed)
   Follow-Up Visit   Subjective  Corey Reilly is a 59 y.o. male who presents for the following: Psoriasis (follow up-needs labs and refill humira).  Psoriasis Location: Was all over Duration: Years Quality: Most clear Associated Signs/Symptoms: Modifying Factors:  Severity:  Timing: Context: Would like moles looked at  The following portions of the chart were reviewed this encounter and updated as appropriate: Tobacco  Allergies  Meds  Problems  Med Hx  Surg Hx  Fam Hx      Objective  Well appearing patient in no apparent distress; mood and affect are within normal limits.  A full examination was performed including scalp, head, eyes, ears, nose, lips, neck, chest, axillae, abdomen, back, buttocks, bilateral upper extremities, bilateral lower extremities, hands, feet, fingers, toes, fingernails, and toenails. All findings within normal limits unless otherwise noted below.   Assessment & Plan  Nevus Mid Back  Annual skin check, UV protection  Psoriasis vulgaris  Other Related Procedures QuantiFERON-TB Gold Plus  Psoriasis (5) Left Lower Leg - Anterior; Right Lower Leg - Anterior; Left Axilla; Right Axilla; Right Inguinal Area  Continue Humira.  Switch from triamcinolone to over-the-counter hydrocortisone for intertriginous areas.  Update tuberculosis test Psoriasis essentially clear except for small patches on the legs and a little bit on the right side.  If he needs more topical triamcinolone he can have the pharmacist call us.  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  We discussed the safety of the biologic  related to Covid.-Is encouraged to call me if any issues come up in the next year.  We will make sure the tuberculosis test is up-to-date. Skin cancer screening performed today.

## 2019-09-21 MED FILL — HUMIRA PEN 40 MG/0.8ML PNKT: 40 | 28 days supply | Qty: 2 | Fill #1

## 2019-09-22 ENCOUNTER — Other Ambulatory Visit: Payer: Self-pay

## 2019-09-22 ENCOUNTER — Telehealth: Payer: Self-pay | Admitting: Psychiatry

## 2019-09-22 MED ORDER — LITHIUM CARBONATE ER 450 MG PO TBCR: 450.0000 mg | EXTENDED_RELEASE_TABLET | Freq: Two times a day (BID) | ORAL | 3 refills | Status: DC

## 2019-09-22 MED FILL — LITHIUM ER 450 MG TABLET: 450 | 30 days supply | Qty: 60 | Fill #0

## 2019-09-22 NOTE — Telephone Encounter (Signed)
Updated Rx sent

## 2019-09-22 NOTE — Telephone Encounter (Signed)
Patient says that he is taking lithium 450 mg says he is taking two a day. He says that at at his last visit in April he is suppose to be at 900 mg . One at night and one in the am. He needs a new script sent in since the old one is wrong. His pharmacy is [facility] and they close at six

## 2019-09-25 ENCOUNTER — Encounter: Payer: Self-pay | Admitting: Dermatology

## 2019-09-27 ENCOUNTER — Telehealth: Payer: Self-pay

## 2019-09-27 NOTE — Telephone Encounter (Signed)
Prior Authorization form faxed over form Cover My Meds for patient's Humira.

## 2019-09-27 NOTE — Telephone Encounter (Signed)
Prior Authorization done through Cover My Meds for Humira.    [employer] is reviewing your PA request. You may close this dialog, return to your dashboard, and perform other tasks.  To check for an update later, open this request again from your dashboard. If [employer] has not replied within 24 hours for urgent requests or within 48 hours for standard requests, please contact [employer] at [phone].

## 2019-10-03 NOTE — Telephone Encounter (Signed)
Prior Authorization approved.   This request has been approved.  Please note any additional information provided by [employer] at the bottom of your screen.

## 2019-10-12 MED FILL — HUMIRA PEN 40 MG/0.8ML PNKT: 40 | 28 days supply | Qty: 2 | Fill #2

## 2019-10-17 MED FILL — VYVANSE 40 MG CAPSULE: 40 | 30 days supply | Qty: 30 | Fill #0

## 2019-10-25 MED FILL — LITHIUM ER 450 MG TABLET: 450 | 30 days supply | Qty: 60 | Fill #0

## 2019-10-25 MED FILL — QUETIAPINE FUMARATE 50 MG T: 50 | 30 days supply | Qty: 90 | Fill #1

## 2019-11-08 ENCOUNTER — Ambulatory Visit: Payer: 59 | Admitting: Psychiatry

## 2019-11-09 MED FILL — ATORVASTATIN 20 MG TABLET: 20 | 90 days supply | Qty: 90 | Fill #2

## 2019-11-20 ENCOUNTER — Other Ambulatory Visit: Payer: Self-pay | Admitting: Psychiatry

## 2019-11-20 DIAGNOSIS — F902 Attention-deficit hyperactivity disorder, combined type: Secondary | ICD-10-CM

## 2019-11-20 MED FILL — ALFUZOSIN HCL ER 10 MG TB24: 10 | 90 days supply | Qty: 180 | Fill #2

## 2019-11-20 MED FILL — LITHIUM ER 450 MG TABLET: 450 | 30 days supply | Qty: 60 | Fill #1

## 2019-11-20 NOTE — Telephone Encounter (Signed)
Next apt 08/11

## 2019-11-21 MED FILL — HUMIRA PEN 40 MG/0.8ML PNKT: 40 | 28 days supply | Qty: 2 | Fill #3

## 2019-11-21 MED FILL — VYVANSE 40 MG CAPSULE: 40 | 30 days supply | Qty: 30 | Fill #0

## 2019-12-06 ENCOUNTER — Other Ambulatory Visit: Payer: Self-pay

## 2019-12-06 ENCOUNTER — Encounter: Payer: Self-pay | Admitting: Psychiatry

## 2019-12-06 ENCOUNTER — Ambulatory Visit (INDEPENDENT_AMBULATORY_CARE_PROVIDER_SITE_OTHER): Payer: 59 | Admitting: Psychiatry

## 2019-12-06 ENCOUNTER — Other Ambulatory Visit: Payer: Self-pay | Admitting: Psychiatry

## 2019-12-06 DIAGNOSIS — Z79899 Other long term (current) drug therapy: Secondary | ICD-10-CM

## 2019-12-06 DIAGNOSIS — F902 Attention-deficit hyperactivity disorder, combined type: Secondary | ICD-10-CM | POA: Diagnosis not present

## 2019-12-06 DIAGNOSIS — F319 Bipolar disorder, unspecified: Secondary | ICD-10-CM | POA: Diagnosis not present

## 2019-12-06 MED ORDER — LITHIUM CARBONATE ER 450 MG PO TBCR: 450.0000 mg | EXTENDED_RELEASE_TABLET | Freq: Two times a day (BID) | ORAL | 1 refills | Status: DC

## 2019-12-06 MED ORDER — QUETIAPINE FUMARATE 50 MG PO TABS: 150.0000 mg | ORAL_TABLET | Freq: Every day | ORAL | 4 refills | Status: DC

## 2019-12-06 MED ORDER — LISDEXAMFETAMINE DIMESYLATE 40 MG PO CAPS: 40.0000 mg | ORAL_CAPSULE | Freq: Every morning | ORAL | 0 refills | Status: DC

## 2019-12-06 MED FILL — QUETIAPINE FUMARATE 50 MG T: 50 | 30 days supply | Qty: 90 | Fill #0

## 2019-12-06 NOTE — Progress Notes (Signed)
Corey Reilly [id number] Sep 25, 1960 59 y.o.  Subjective:   Patient ID:  Corey Reilly is a 59 y.o. (DOB 05-09-1960) male.  Chief Complaint:  Chief Complaint  Patient presents with  . Follow-up    mood    Depression        Associated symptoms include decreased concentration.  Associated symptoms include no suicidal ideas.  Corey Reilly presents to the office today for follow-up of bipolar disorder and concerns about focus and possible ADD.  When seen December 2019.  No meds were changed.  Labs were received with low normal lithium level.  Creatinine was slightly elevated at 1.4.  When seen in June.  No meds were changed.  Was recommended that he try NAC 600 mg daily for cognition.  Completed ADHD self questionnaire which was high on both the inattention and hyperactivity scales.  Tried NAC 1000 mg with less memory issues but not dramatic.  Does think it's improved but not enough and wanted to pursue a trial of stimulants.  This was discussed with his wife who noted the following: Periods of poor function intermittently.  Gets distracted and then forgets.  Cannot control it.  Wonders if it's ADD.  D dx ADHD and the 2 of them are similar and she is on Adderall.  At [employer].  Constantly distracted.  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  Therefore at the visit in August we initiated a trial of Vyvanse 30 mg daily. He called back September 3 asking for refills stating it was working well.  He did increase Vyvanse to 40 mg daily.  seen February 06, 2019.  Rec trial NAC 1000 mg for STM and continue Vyvanse 30 as best tolerated dose.  No other med changes.  Overall doing OK.  Expected stress with Covid and politics.  Mental health is OK. Doesn't feel the Vyvanse in any kick or mood but focus is definitely better.  Last month is best month ever and much mor productive and efficient.  Lost a little weight on it.  SE low appetite so skips on the weekend.  Less distractible  with Vyvanse and only uses work days.    seen May 09, 2019.  No meds were changed.  08/07/19 appt with the following noted: He increased the lithium dose back to 900 mg daily about first of April, because he was having some manic and depressive symptoms.  Lithium level requested was 0.6 at lower dosage of 675 mg daily.. Mood the last few days is OK.  Dealing with some pain in TMJ that is affecting him. 2 nights of no sleep but otherwise normal sleep. Has used more caffeine than usual but only in the morning.    Patient reports stable mood and denies depressed or irritable moods.  Patient denies any recent difficulty with anxiety.  Patient denies difficulty with sleep initiation or maintenance. Denies appetite disturbance.  Patient reports that energy and motivation have been good.  Patient denies any difficulty with concentration.  Patient denies any suicidal ideation. Plan: He is not manic and no longer depressed after increase lithium to 900 mg daily.  stable even with Vyvanse and doing well with lithium  12/06/19 appt with the following noted: All in all well.  Not sign manic.  Stayed on lithium 900.  Sleep good on 50 mg daily 7-8 hours.  No SE except  Not hungry on Vyvanse.  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 D  doesn't drink bc pt has hx of alcohol dependence.  He's been sober for many years.  Attention problems.  Talked to wife.    Past psychiatric medication trials include Geodon which caused sedation, Zyprexa which caused sedation, Latuda which caused akathisia and Seroquel Found out Aunt has bipolar disabled.  Review of Systems:  Review of Systems  HENT: Positive for dental problem.   Cardiovascular: Negative for palpitations.  Neurological: Negative for dizziness, tremors and weakness.  Psychiatric/Behavioral: Positive for decreased concentration and depression. Negative for agitation, behavioral problems, confusion, dysphoric mood,  hallucinations, self-injury, sleep disturbance and suicidal ideas. The patient is not nervous/anxious and is not hyperactive.     Medications: I have reviewed the patient's current medications.  Current Outpatient Medications  Medication Sig Dispense Refill  . Adalimumab (HUMIRA PEN) 40 MG/0.8ML PNKT Inject 40 mg into the skin every 14 (fourteen) days. 2 each 10  . alfuzosin (UROXATRAL) 10 MG 24 hr tablet Take 10 mg by mouth daily with breakfast.    . atorvastatin (LIPITOR) 20 MG tablet Take 10 mg by mouth daily.  11  . lisdexamfetamine (VYVANSE) 40 MG capsule Take 1 capsule (40 mg total) by mouth every morning. 90 capsule 0  . lithium carbonate (ESKALITH) 450 MG CR tablet Take 1 tablet (450 mg total) by mouth 2 (two) times daily. 180 tablet 1  . QUEtiapine (SEROQUEL) 50 MG tablet Take 3 tablets (150 mg total) by mouth at bedtime. 90 tablet 4   No current facility-administered medications for this visit.    Medication Side Effects: None  Allergies: No Known Allergies  Past Medical History:  Diagnosis Date  . Bipolar disorder ([place])   . Depression   . Psoriasis   . Ulcer     Family History  Problem Relation Age of Onset  . Hyperlipidemia Father   . COPD Maternal Uncle     Social History   Socioeconomic History  . Marital status: Married    Spouse name: Not on file  . Number of children: Not on file  . Years of education: Not on file  . Highest education level: Not on file  Occupational History  . Not on file  Tobacco Use  . Smoking status: Never Smoker  . Smokeless tobacco: Never Used  Substance and Sexual Activity  . Alcohol use: Not on file  . Drug use: Not on file  . Sexual activity: Not on file  Other Topics Concern  . Not on file  Social History Narrative  . Not on file   Social Determinants of Health   Financial Resource Strain:   . Difficulty of Paying Living Expenses:   Food Insecurity:   . Worried About Charity fundraiser in the Last Year:   .  Arboriculturist in the Last Year:   Transportation Needs:   . Film/video editor (Medical):   Marland Kitchen Lack of Transportation (Non-Medical):   Physical Activity:   . Days of Exercise per Week:   . Minutes of Exercise per Session:   Stress:   . Feeling of Stress :   Social Connections:   . Frequency of Communication with Friends and Family:   . Frequency of Social Gatherings with Friends and Family:   . Attends Religious Services:   . Active Member of Clubs or Organizations:   . Attends Archivist Meetings:   Marland Kitchen Marital Status:   Intimate Partner Violence:   . Fear of Current or Ex-Partner:   . Emotionally Abused:   .  Physically Abused:   . Sexually Abused:     Past Medical History, Surgical history, Social history, and Family history were reviewed and updated as appropriate.   Please see review of systems for further details on the patient's review from today.   Objective:   Physical Exam:  There were no vitals taken for this visit.  Physical Exam Constitutional:      General: He is not in acute distress. Musculoskeletal:        General: No deformity.  Neurological:     Mental Status: He is alert and oriented to person, place, and time.     Cranial Nerves: No dysarthria.     Coordination: Coordination normal.  Psychiatric:        Attention and Perception: Attention and perception normal. He does not perceive auditory or visual hallucinations.        Mood and Affect: Mood normal. Mood is not anxious, depressed or elated. Affect is not labile, blunt, angry or inappropriate.        Speech: Speech normal.        Behavior: Behavior normal. Behavior is cooperative.        Thought Content: Thought content normal. Thought content is not paranoid or delusional. Thought content does not include homicidal or suicidal ideation. Thought content does not include homicidal or suicidal plan.        Cognition and Memory: Cognition and memory normal.        Judgment: Judgment  normal.     Comments: Insight intact     Lab Review:     Component Value Date/Time   NA 141 08/09/2019 1521   K 4.0 08/09/2019 1521   CL 107 08/09/2019 1521   CO2 26 08/09/2019 1521   GLUCOSE 96 08/09/2019 1521   BUN 10 08/09/2019 1521   CREATININE 1.39 (H) 08/09/2019 1521   CALCIUM 9.4 08/09/2019 1521   PROT 8.3 05/21/2011 1753   ALBUMIN 4.4 05/21/2011 1753   AST 48 (H) 05/21/2011 1753   ALT 37 05/21/2011 1753   ALKPHOS 87 05/21/2011 1753   BILITOT 2.5 (H) 05/21/2011 1753   GFRNONAA 79 (L) 05/21/2011 1753   GFRAA >90 05/21/2011 1753       Component Value Date/Time   WBC 19.2 (H) 05/21/2011 1753   RBC 4.91 05/21/2011 1753   HGB 16.7 05/21/2011 1753   HCT 46.1 05/21/2011 1753   PLT 216 05/21/2011 1753   MCV 93.9 05/21/2011 1753   MCH 34.0 05/21/2011 1753   MCHC 36.2 (H) 05/21/2011 1753   RDW 13.0 05/21/2011 1753   LYMPHSABS 0.8 05/21/2011 1753   MONOABS 1.2 (H) 05/21/2011 1753   EOSABS 0.0 05/21/2011 1753   BASOSABS 0.0 05/21/2011 1753   lithium level October 21, 2017 0.4.  This was on 675 mg a day same his current dosage.  His level in November 18 was 1.10 900 mg a day.  The dosage was reduced because of elevated creatinine which in June was 1.53. Lithium Lvl  Date Value Ref Range Status  08/09/2019 1.0 0.6 - 1.2 mmol/L Final   This was on 900 mg lithium daily.  No results found for: PHENYTOIN, PHENOBARB, VALPROATE, CBMZ   Adult self-report scale for ADD score was 29 on inattentive side which is high and 23 on the hyperactive scale which is borderline high suggestive of ADHD  .res Assessment: Plan:    Ida was seen today for follow-up.  Diagnoses and all orders for this visit:  Bipolar I disorder ([place]) -  lithium carbonate (ESKALITH) 450 MG CR tablet; Take 1 tablet (450 mg total) by mouth 2 (two) times daily. -     QUEtiapine (SEROQUEL) 50 MG tablet; Take 3 tablets (150 mg total) by mouth at bedtime.  Lithium use  Attention deficit hyperactivity  disorder (ADHD), combined type -     lisdexamfetamine (VYVANSE) 40 MG capsule; Take 1 capsule (40 mg total) by mouth every morning.   Bipolar disorder under good control with lithium.  Check lithium levels and BMP.  Hx borderline creatinine. Counseled patient regarding potential benefits, risks, and side effects of lithium to include potential risk of lithium affecting thyroid and renal function.  Discussed need for periodic lab monitoring to determine drug level and to assess for potential adverse effects.  Counseled patient regarding signs and symptoms of lithium toxicity and advised that they notify office immediately or seek urgent medical attention if experiencing these signs and symptoms.  Patient advised to contact office with any questions or concerns.  Get labs ASAP.  Emphasized this.  He is not manic and no longer depressed after increase lithium to 900 mg daily.  stable even with Vyvanse and doing well with lithium  Discussed his high score on the ADHD self questionnaire.  He has a daughter with ADD.  He feels this is interfering with his job function and quality of life and would like to try treatment.  We discussed stimulant and non-stimulant options for treatment of ADHD.  Discussed the risk that some of these treatments could trigger mania and a bipolar patient.  Discussed side effects in detail.  Discussed the differences between types of stimulants including methylphenidate and amphetamine based.  Also discussed the differences between short acting and long-acting stimulants and generally long-acting are preferred in adults.  Discussed this again at this visit because his daughter takes Adderall and he wanted to know the difference.  Discussed potential addiction risk.  Discussed potential benefits, risks, and side effects of stimulants with patient to include increased heart rate, palpitations, insomnia, increased anxiety, increased irritability, or decreased appetite.  Instructed patient  to contact office if experiencing any significant tolerability issues.  Vyvanse 40 mg daily. Good responsel.  We will proceed cautiously but may increase if there is no benefit at the lower dosages.  He is only taking it on workdays and on heavy work days occasionally doubles up.  Discussed the risk with this because that is higher than the usual recommended dose.  However he is not taking it at all on weekends or days that he is not working.  This appt was 30 mins.  FU 4-5 mos   Lynder Parents, MD, DFAPA   Please see After Visit Summary for patient specific instructions.  No future appointments.  No orders of the defined types were placed in this encounter.     -------------------------------

## 2019-12-18 MED FILL — HUMIRA PEN 40 MG/0.8ML PNKT: 40 | 28 days supply | Qty: 2 | Fill #3

## 2019-12-21 MED FILL — VYVANSE 40 MG CAPSULE: 40 | 90 days supply | Qty: 90 | Fill #0

## 2019-12-21 MED FILL — LITHIUM ER 450 MG TABLET: 450 | 30 days supply | Qty: 60 | Fill #2

## 2019-12-22 ENCOUNTER — Telehealth: Payer: Self-pay | Admitting: Psychiatry

## 2019-12-22 DIAGNOSIS — F319 Bipolar disorder, unspecified: Secondary | ICD-10-CM | POA: Diagnosis not present

## 2019-12-22 DIAGNOSIS — Z79899 Other long term (current) drug therapy: Secondary | ICD-10-CM | POA: Diagnosis not present

## 2019-12-22 NOTE — Telephone Encounter (Signed)
Pt left a voicemail and said that he was going to have his blood woirk done today. He thinks that he is jacked up and that his lithium dose needs to be changed. He just wanted you to be aware. Please call him at [phone].

## 2019-12-25 NOTE — Telephone Encounter (Signed)
OK.  Since I understand he has pending labs available for lithium and he plans to get level, prefer towait for level to make changes

## 2019-12-25 NOTE — Telephone Encounter (Signed)
Labs available but no lithium level noted?

## 2019-12-26 NOTE — Telephone Encounter (Signed)
G[place]am called back today about his labs.  Because he is all jacked up and kidney functions are off he has lowered his lithium dose.  I called [facility] to see if they even did lithium level.  They did not so I had it added on so hopefully we'll the results in the next 24 hrs.  Lamount of course is wanting to know what to do about his lithium.

## 2019-12-26 NOTE — Telephone Encounter (Signed)
Pending his lithium level, should be received in next 24 hours.

## 2019-12-27 NOTE — Telephone Encounter (Signed)
Order received from [facility] to confirm adding the Lithium level on to his previous labs drawn. Although patient does have a lithium level standing order.  This was signed and faxed back by Leda Gauze. Still waiting for lithium level results.

## 2019-12-28 ENCOUNTER — Telehealth: Payer: Self-pay | Admitting: Psychiatry

## 2019-12-28 LAB — BASIC METABOLIC PANEL
BUN/Creatinine Ratio: 5 (calc) — ABNORMAL LOW (ref 6–22)
BUN: 8 mg/dL (ref 7–25)
CO2: 29 mmol/L (ref 20–32)
Calcium: 10.7 mg/dL — ABNORMAL HIGH (ref 8.6–10.3)
Chloride: 102 mmol/L (ref 98–110)
Creat: 1.56 mg/dL — ABNORMAL HIGH (ref 0.70–1.33)
Glucose, Bld: 104 mg/dL (ref 65–139)
Potassium: 4 mmol/L (ref 3.5–5.3)
Sodium: 139 mmol/L (ref 135–146)

## 2019-12-28 LAB — TEST AUTHORIZATION 2

## 2019-12-28 LAB — TSH: TSH: 2.05 mIU/L (ref 0.40–4.50)

## 2019-12-28 LAB — LITHIUM LEVEL: Lithium Lvl: 1.1 mmol/L (ref 0.6–1.2)

## 2019-12-28 NOTE — Telephone Encounter (Signed)
Lithium level was stable at 1.1.  He indicated in an earlier phone call at the end of August that he felt "jacked up" but was concerned about his kidney function and had reduced the lithium dosage.  Ask what dosage of lithium he has been taking over the last week prior to this blood test.    I am a little concerned about the gradual increase in his creatinine also and would like him to move his appointment up to see me sooner.  It is not however an emergency.  Anytime in the next couple of months is fine as long as his mood remains stable.

## 2019-12-28 NOTE — Telephone Encounter (Signed)
Pt called to check status on Lithium Level labs. Pt now doing so good, he stated. Please contact @ [phone]

## 2019-12-28 NOTE — Telephone Encounter (Signed)
See other phone message  

## 2019-12-28 NOTE — Telephone Encounter (Signed)
Spoke with patient and he only decreased his dose of lithium for 1 day after his lab work. Currently taking 450 mg bid lithium. He says his mood is good, he is a bit jacked up at times but thinks it might just be situational with Covid and such. He will call back to set up apt.

## 2019-12-28 NOTE — Telephone Encounter (Signed)
Lithium level is in results, 1.1 drawn 12/22/2019 Last one 4 months ago 1.0

## 2019-12-29 NOTE — Progress Notes (Signed)
Manic sx minimal now.  Lithium level adequate.  Still needs earlier appt to discuss Cr.  Is on cancellation list.

## 2020-01-18 MED FILL — LITHIUM ER 450 MG TABLET: 450 | 90 days supply | Qty: 180 | Fill #0

## 2020-01-22 ENCOUNTER — Other Ambulatory Visit: Payer: Self-pay

## 2020-01-22 DIAGNOSIS — F319 Bipolar disorder, unspecified: Secondary | ICD-10-CM

## 2020-01-23 MED ORDER — LITHIUM CARBONATE ER 450 MG PO TBCR: 450.0000 mg | EXTENDED_RELEASE_TABLET | Freq: Two times a day (BID) | ORAL | 1 refills | Status: DC

## 2020-01-23 MED FILL — HUMIRA PEN 40 MG/0.8ML PNKT: 40 | 28 days supply | Qty: 2 | Fill #4

## 2020-01-23 NOTE — Telephone Encounter (Signed)
Please clarify his pharmacy then send if this one

## 2020-02-07 MED FILL — ATORVASTATIN CALCIUM 20 MG: 20 | 90 days supply | Qty: 90 | Fill #3

## 2020-02-21 MED FILL — ALFUZOSIN HCL ER 10 MG TB24: 10 | 90 days supply | Qty: 180 | Fill #3

## 2020-03-07 MED FILL — HUMIRA PEN 40 MG/0.8ML PNKT: 40 | 28 days supply | Qty: 2 | Fill #5

## 2020-03-25 ENCOUNTER — Other Ambulatory Visit: Payer: Self-pay | Admitting: Psychiatry

## 2020-03-25 DIAGNOSIS — F902 Attention-deficit hyperactivity disorder, combined type: Secondary | ICD-10-CM

## 2020-03-25 MED FILL — VYVANSE 40 MG CAPSULE: 40 | 90 days supply | Qty: 90 | Fill #0

## 2020-04-04 DIAGNOSIS — Z0000 Encounter for general adult medical examination without abnormal findings: Secondary | ICD-10-CM | POA: Diagnosis not present

## 2020-04-04 DIAGNOSIS — E039 Hypothyroidism, unspecified: Secondary | ICD-10-CM | POA: Diagnosis not present

## 2020-04-04 DIAGNOSIS — E7800 Pure hypercholesterolemia, unspecified: Secondary | ICD-10-CM | POA: Diagnosis not present

## 2020-04-04 DIAGNOSIS — Z125 Encounter for screening for malignant neoplasm of prostate: Secondary | ICD-10-CM | POA: Diagnosis not present

## 2020-04-04 DIAGNOSIS — Z23 Encounter for immunization: Secondary | ICD-10-CM | POA: Diagnosis not present

## 2020-04-04 DIAGNOSIS — F329 Major depressive disorder, single episode, unspecified: Secondary | ICD-10-CM | POA: Diagnosis not present

## 2020-04-10 MED FILL — HUMIRA PEN 40 MG/0.8ML PNKT: 40 | 28 days supply | Qty: 2 | Fill #6

## 2020-04-15 MED FILL — LITHIUM ER 450 MG TABLET: 450 | 90 days supply | Qty: 180 | Fill #1

## 2020-04-15 MED FILL — QUETIAPINE FUMARATE 50 MG T: 50 | 30 days supply | Qty: 90 | Fill #1

## 2020-05-07 ENCOUNTER — Other Ambulatory Visit: Payer: Self-pay | Admitting: Pharmacist

## 2020-05-07 ENCOUNTER — Other Ambulatory Visit: Payer: Self-pay | Admitting: Dermatology

## 2020-05-07 MED ORDER — HUMIRA PEN 40 MG/0.8ML ~~LOC~~ PNKT: 40.0000 mg | PEN_INJECTOR | SUBCUTANEOUS | 3 refills | Status: DC

## 2020-05-07 MED FILL — ATORVASTATIN CALCIUM 20 MG: 20 | 90 days supply | Qty: 90 | Fill #0

## 2020-05-08 ENCOUNTER — Ambulatory Visit (INDEPENDENT_AMBULATORY_CARE_PROVIDER_SITE_OTHER): Payer: 59 | Admitting: Psychiatry

## 2020-05-08 ENCOUNTER — Encounter: Payer: Self-pay | Admitting: Psychiatry

## 2020-05-08 ENCOUNTER — Other Ambulatory Visit: Payer: Self-pay | Admitting: Psychiatry

## 2020-05-08 ENCOUNTER — Other Ambulatory Visit: Payer: Self-pay

## 2020-05-08 DIAGNOSIS — F319 Bipolar disorder, unspecified: Secondary | ICD-10-CM

## 2020-05-08 DIAGNOSIS — Z79899 Other long term (current) drug therapy: Secondary | ICD-10-CM | POA: Diagnosis not present

## 2020-05-08 DIAGNOSIS — F902 Attention-deficit hyperactivity disorder, combined type: Secondary | ICD-10-CM

## 2020-05-08 MED ORDER — LITHIUM CARBONATE ER 450 MG PO TBCR: 450.0000 mg | EXTENDED_RELEASE_TABLET | Freq: Two times a day (BID) | ORAL | 1 refills | Status: DC

## 2020-05-08 MED ORDER — LISDEXAMFETAMINE DIMESYLATE 40 MG PO CAPS: ORAL_CAPSULE | ORAL | 0 refills | Status: DC

## 2020-05-08 NOTE — Progress Notes (Signed)
Corey Reilly SZ:[id number] 1960-08-01 60 y.o.  Subjective:   Patient ID:  Corey Reilly is a 60 y.o. (DOB 14-Dec-1960) male.  Chief Complaint:  Chief Complaint  Patient presents with  . Follow-up  . Sleeping Problem    Depression        Associated symptoms include decreased concentration.  Associated symptoms include no suicidal ideas.  Corey Reilly presents to the office today for follow-up of bipolar disorder and concerns about focus and possible ADD.  When seen December 2019.  No meds were changed.  Labs were received with low normal lithium level.  Creatinine was slightly elevated at 1.4.  When seen in June.  No meds were changed.  Was recommended that he try NAC 600 mg daily for cognition.  Completed ADHD self questionnaire which was high on both the inattention and hyperactivity scales.  Tried NAC 1000 mg with less memory issues but not dramatic.  Does think it's improved but not enough and wanted to pursue a trial of stimulants.  This was discussed with his wife who noted the following: Periods of poor function intermittently.  Gets distracted and then forgets.  Cannot control it.  Wonders if it's ADD.  D dx ADHD and the 2 of them are similar and she is on Adderall.  At [employer].  Constantly distracted.  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  Therefore at the visit in August we initiated a trial of Vyvanse 30 mg daily. He called back September 3 asking for refills stating it was working well.  He did increase Vyvanse to 40 mg daily.  seen February 06, 2019.  Rec trial NAC 1000 mg for STM and continue Vyvanse 30 as best tolerated dose.  No other med changes.  Overall doing OK.  Expected stress with Covid and politics.  Mental health is OK. Doesn't feel the Vyvanse in any kick or mood but focus is definitely better.  Last month is best month ever and much mor productive and efficient.  Lost a little weight on it.  SE low appetite so skips on the weekend.  Less  distractible with Vyvanse and only uses work days.    seen May 09, 2019.  No meds were changed.  08/07/19 appt with the following noted: He increased the lithium dose back to 900 mg daily about first of April, because he was having some manic and depressive symptoms.  Lithium level requested was 0.6 at lower dosage of 675 mg daily.. Mood the last few days is OK.  Dealing with some pain in TMJ that is affecting him. 2 nights of no sleep but otherwise normal sleep. Has used more caffeine than usual but only in the morning.    Patient reports stable mood and denies depressed or irritable moods.  Patient denies any recent difficulty with anxiety.  Patient denies difficulty with sleep initiation or maintenance. Denies appetite disturbance.  Patient reports that energy and motivation have been good.  Patient denies any difficulty with concentration.  Patient denies any suicidal ideation. Plan: He is not manic and no longer depressed after increase lithium to 900 mg daily.  stable even with Vyvanse and doing well with lithium  12/06/19 appt with the following noted: All in all well.  Not sign manic.  Stayed on lithium 900.  Sleep good on 50 mg daily 7-8 hours.  No SE except  Not hungry on Vyvanse.  05/08/2020 appt noted: Mood OK overall.  But got worked up too much last week  for about 3 hours but it only lasted those hours. Some problems with sleep without apparent reason though occ forgets it. Sometimes a night with no sleep 1-2 times a month.  Usually good sleep routine. Melatonin and benadryl  Health is good.  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 D doesn't drink bc pt has hx of alcohol dependence.  He's been sober for many years.  Attention problems.  Talked to wife.    Past psychiatric medication trials include Geodon which caused sedation, Zyprexa which caused sedation, Latuda which caused akathisia and Seroquel Found out Aunt has bipolar disabled.  Review of  Systems:  Review of Systems  HENT: Negative for dental problem.   Cardiovascular: Negative for palpitations.  Neurological: Negative for dizziness, tremors and weakness.  Psychiatric/Behavioral: Positive for decreased concentration and depression. Negative for agitation, behavioral problems, confusion, dysphoric mood, hallucinations, self-injury, sleep disturbance and suicidal ideas. The patient is not nervous/anxious and is not hyperactive.     Medications: I have reviewed the patient's current medications.  Current Outpatient Medications  Medication Sig Dispense Refill  . Adalimumab (HUMIRA PEN) 40 MG/0.8ML PNKT Inject 40 mg into the skin every 14 (fourteen) days. 2 each 3  . alfuzosin (UROXATRAL) 10 MG 24 hr tablet Take 10 mg by mouth daily with breakfast.    . atorvastatin (LIPITOR) 20 MG tablet Take 10 mg by mouth daily.  11  . QUEtiapine (SEROQUEL) 50 MG tablet Take 3 tablets (150 mg total) by mouth at bedtime. (Patient taking differently: Take 50 mg by mouth at bedtime.) 90 tablet 4  . lisdexamfetamine (VYVANSE) 40 MG capsule TAKE 1 CAPSULE (40 MG TOTAL) BY MOUTH EVERY MORNING. 90 capsule 0  . lithium carbonate (ESKALITH) 450 MG CR tablet Take 1 tablet (450 mg total) by mouth 2 (two) times daily. 180 tablet 1   No current facility-administered medications for this visit.    Medication Side Effects: None  Allergies: No Known Allergies  Past Medical History:  Diagnosis Date  . Bipolar disorder ([facility])   . Depression   . Psoriasis   . Ulcer     Family History  Problem Relation Age of Onset  . Hyperlipidemia Father   . COPD Maternal Uncle     Social History   Socioeconomic History  . Marital status: Married    Spouse name: Not on file  . Number of children: Not on file  . Years of education: Not on file  . Highest education level: Not on file  Occupational History  . Not on file  Tobacco Use  . Smoking status: Never Smoker  . Smokeless tobacco: Never Used   Substance and Sexual Activity  . Alcohol use: Not on file  . Drug use: Not on file  . Sexual activity: Not on file  Other Topics Concern  . Not on file  Social History Narrative  . Not on file   Social Determinants of Health   Financial Resource Strain: Not on file  Food Insecurity: Not on file  Transportation Needs: Not on file  Physical Activity: Not on file  Stress: Not on file  Social Connections: Not on file  Intimate Partner Violence: Not on file    Past Medical History, Surgical history, Social history, and Family history were reviewed and updated as appropriate.   Please see review of systems for further details on the patient's review from today.   Objective:   Physical Exam:  There were no vitals  taken for this visit.  Physical Exam Constitutional:      General: He is not in acute distress. Musculoskeletal:        General: No deformity.  Neurological:     Mental Status: He is alert and oriented to person, place, and time.     Cranial Nerves: No dysarthria.     Coordination: Coordination normal.  Psychiatric:        Attention and Perception: Attention and perception normal. He does not perceive auditory or visual hallucinations.        Mood and Affect: Mood normal. Mood is not anxious, depressed or elated. Affect is not labile, blunt, angry or inappropriate.        Speech: Speech normal.        Behavior: Behavior normal. Behavior is cooperative.        Thought Content: Thought content normal. Thought content is not paranoid or delusional. Thought content does not include homicidal or suicidal ideation. Thought content does not include homicidal or suicidal plan.        Cognition and Memory: Cognition and memory normal.        Judgment: Judgment normal.     Comments: Insight intact     Lab Review:     Component Value Date/Time   NA 139 12/22/2019 1151   K 4.0 12/22/2019 1151   CL 102 12/22/2019 1151   CO2 29 12/22/2019 1151   GLUCOSE 104 12/22/2019  1151   BUN 8 12/22/2019 1151   CREATININE 1.56 (H) 12/22/2019 1151   CALCIUM 10.7 (H) 12/22/2019 1151   PROT 8.3 05/21/2011 1753   ALBUMIN 4.4 05/21/2011 1753   AST 48 (H) 05/21/2011 1753   ALT 37 05/21/2011 1753   ALKPHOS 87 05/21/2011 1753   BILITOT 2.5 (H) 05/21/2011 1753   GFRNONAA 79 (L) 05/21/2011 1753   GFRAA >90 05/21/2011 1753       Component Value Date/Time   WBC 19.2 (H) 05/21/2011 1753   RBC 4.91 05/21/2011 1753   HGB 16.7 05/21/2011 1753   HCT 46.1 05/21/2011 1753   PLT 216 05/21/2011 1753   MCV 93.9 05/21/2011 1753   MCH 34.0 05/21/2011 1753   MCHC 36.2 (H) 05/21/2011 1753   RDW 13.0 05/21/2011 1753   LYMPHSABS 0.8 05/21/2011 1753   MONOABS 1.2 (H) 05/21/2011 1753   EOSABS 0.0 05/21/2011 1753   BASOSABS 0.0 05/21/2011 1753   lithium level October 21, 2017 0.4.  This was on 675 mg a day same his current dosage.  His level in November 18 was 1.10 900 mg a day.  The dosage was reduced because of elevated creatinine which in June was 1.53. Lithium Lvl  Date Value Ref Range Status  12/22/2019 1.1 0.6 - 1.2 mmol/L Final   This was on 900 mg lithium daily.  No results found for: PHENYTOIN, PHENOBARB, VALPROATE, CBMZ   Adult self-report scale for ADD score was 29 on inattentive side which is high and 23 on the hyperactive scale which is borderline high suggestive of ADHD  .res Assessment: Plan:    Corey Reilly was seen today for follow-up and sleeping problem.  Diagnoses and all orders for this visit:  Bipolar I disorder ([place]) -     Lithium level -     Basic metabolic panel -     lithium carbonate (ESKALITH) 450 MG CR tablet; Take 1 tablet (450 mg total) by mouth 2 (two) times daily.  Attention deficit hyperactivity disorder (ADHD), combined type -  lisdexamfetamine (VYVANSE) 40 MG capsule; TAKE 1 CAPSULE (40 MG TOTAL) BY MOUTH EVERY MORNING.  Lithium use -     Lithium level -     Basic metabolic panel   Bipolar disorder under good control with lithium.   Check lithium levels and BMP.  Hx borderline creatinine. Counseled patient regarding potential benefits, risks, and side effects of lithium to include potential risk of lithium affecting thyroid and renal function.  Discussed need for periodic lab monitoring to determine drug level and to assess for potential adverse effects.  Counseled patient regarding signs and symptoms of lithium toxicity and advised that they notify office immediately or seek urgent medical attention if experiencing these signs and symptoms.  Patient advised to contact office with any questions or concerns.  Get labs ASAP.  Emphasized this.  He is not manic and no longer depressed after increase lithium to 900 mg daily.  stable even with Vyvanse 40 and doing well with lithium  Discussed his high score on the ADHD self questionnaire.  He has a daughter with ADD.  He feels this is interfering with his job function and quality of life and would like to try treatment.  We discussed stimulant and non-stimulant options for treatment of ADHD.  Discussed the risk that some of these treatments could trigger mania and a bipolar patient.  Discussed side effects in detail.  Discussed the differences between types of stimulants including methylphenidate and amphetamine based.  Also discussed the differences between short acting and long-acting stimulants and generally long-acting are preferred in adults.  Discussed this again at this visit because his daughter takes Adderall and he wanted to know the difference.  Discussed potential addiction risk.  Discussed potential benefits, risks, and side effects of stimulants with patient to include increased heart rate, palpitations, insomnia, increased anxiety, increased irritability, or decreased appetite.  Instructed patient to contact office if experiencing any significant tolerability issues.  Vyvanse 40 mg daily. Good responsel.  We will proceed cautiously but may increase if there is no benefit at the  lower dosages.  He is only taking it on workdays and on heavy work days occasionally doubles up.  Discussed the risk with this because that is higher than the usual recommended dose.  However he is not taking it at all on weekends or days that he is not working.  Productivity better and better on task with Vyvanse 40.  This appt was 30 mins.  FU 5-6  mos   Lynder Parents, MD, DFAPA   Please see After Visit Summary for patient specific instructions.  No future appointments.  Orders Placed This Encounter  Procedures  . Lithium level  . Basic metabolic panel      -------------------------------

## 2020-05-20 MED FILL — HUMIRA PEN 40 MG/0.8ML PNKT: 40 | 28 days supply | Qty: 2 | Fill #0

## 2020-05-22 IMAGING — CT CT HEART MORP W/ CTA COR W/ SCORE W/ CA W/CM &/OR W/O CM
4 of 7 series · 8 of 20 positions shown, 9 images · IV contrast (APPLIED)
Comparison: None

Addendum:
CLINICAL DATA: 57-year-old male with chest pain.

EXAM:
Cardiac/Coronary  CT
TECHNIQUE: The patient was scanned on a [employer] Force scanner.

[Series 6: best diast 73 % · axial · 0.39mm/px · z∈[+1212,+1259]mm · 2 of 348 slices shown, 3 images]
[im 116/348  vessel]
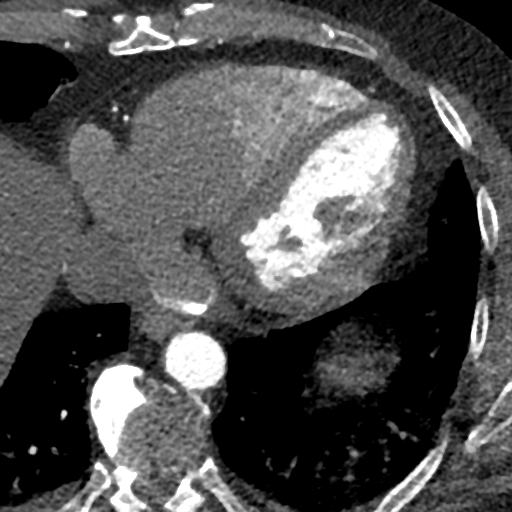
[im 116/348  lung]
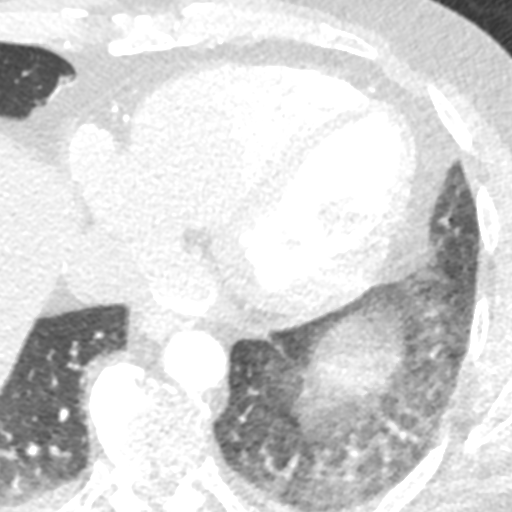
[im 232/348  vessel]
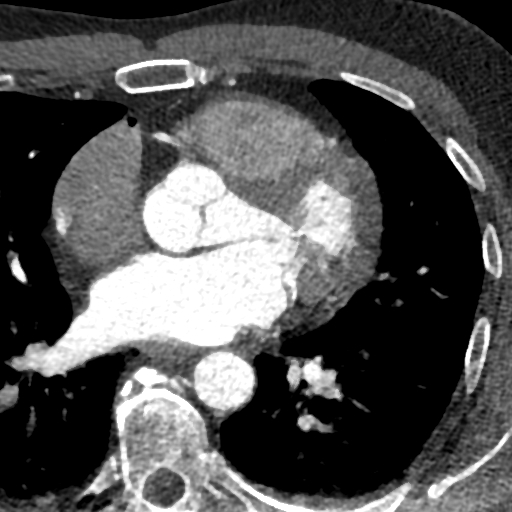

[Series 7: best syst 35 % · axial · 0.39mm/px · z∈[+1212,+1259]mm · 2 of 348 slices shown]
[im 116/348  vessel]
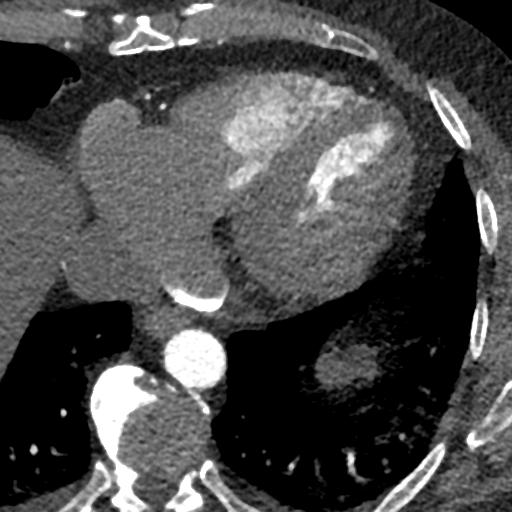
[im 232/348  vessel]
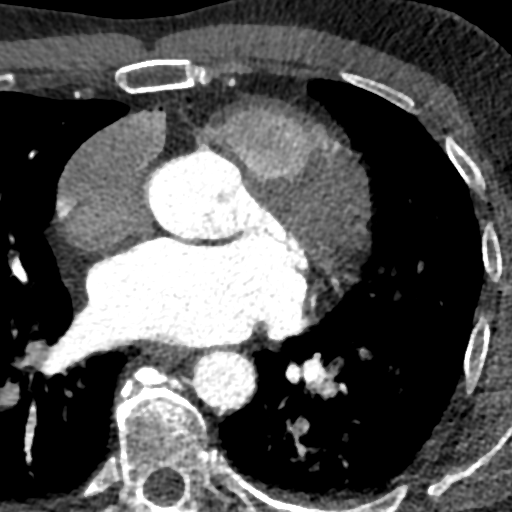

[Series 8: ts diast sharp 73 % · axial · 0.39mm/px · z∈[+1212,+1259]mm · 2 of 348 slices shown]
[im 116/348  lung]
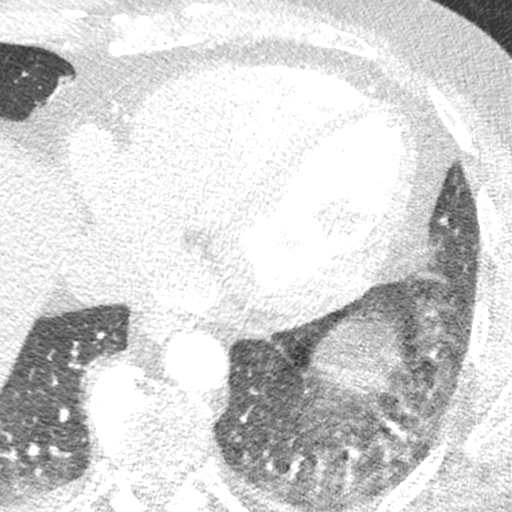
[im 232/348  lung]
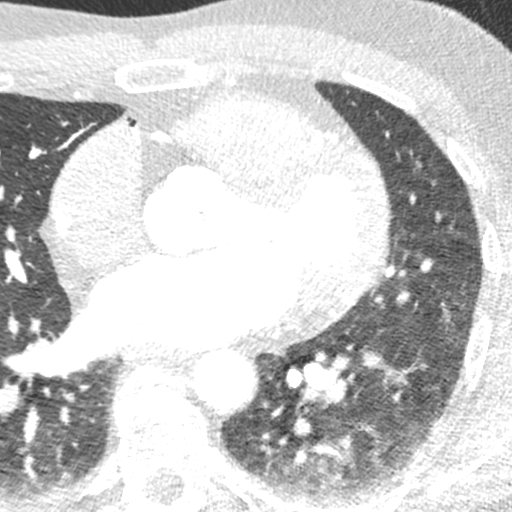

[Series 9: ts syst sharp 35 % · axial · 0.39mm/px · z∈[+1212,+1259]mm · 2 of 348 slices shown]
[im 116/348  lung]
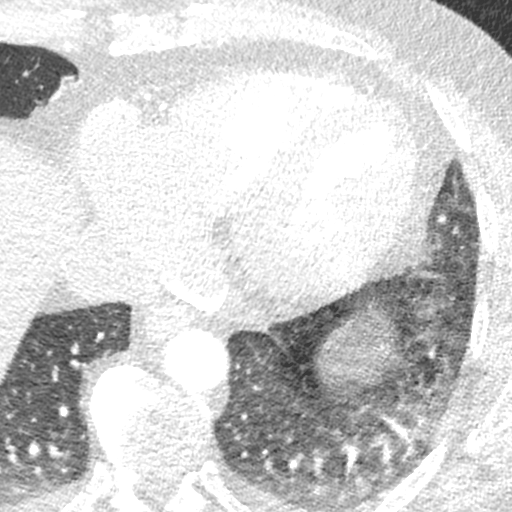
[im 232/348  lung]
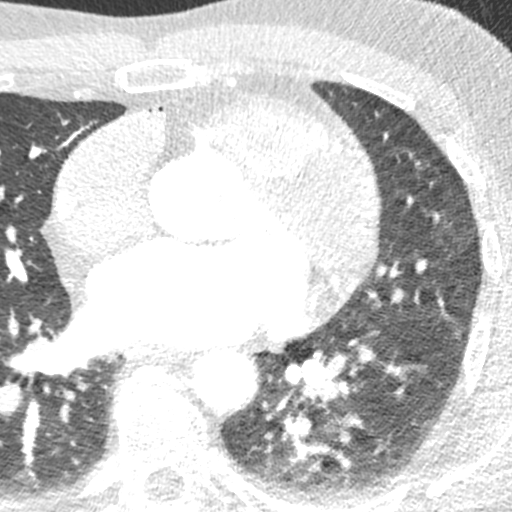

[8 of 20 positions shown; findings below may reference images not displayed]



Aorta:  Normal size.  No calcifications.  No dissection.

Aortic Valve:  Trileaflet.  No calcifications.

Coronary Arteries:  Normal coronary origin.  Right dominance.

RCA is a large dominant artery that gives rise to PDA and PLVB.
There is no plaque.

Left main is a large artery that gives rise to LAD and LCX arteries.
Left main has no plaque.

LAD is a large vessel that gives rise to one large diagonal artery
and has minimal plaque in the proximal portion associated with 0-25%
stenosis.

D1 is a large artery that has minimal plaque in the proximal portion
associated with 0-25% stenosis..

LCX is a non-dominant artery that gives rise to one large OM1
branch. There is minimal non-obstructive plaque.

OM1 is a large branch that has minimal plaque.

Other findings:

Normal pulmonary vein drainage into the left atrium.

Normal let atrial appendage without a thrombus.
IMPRESSION: 1. Coronary calcium score of 1. This was 43 percentile for age and
sex matched control.

2. Normal coronary origin with right dominance.

3. Minimal non-obstructive CAD, risk factor modification is
recommended.

4. Dilated pulmonary artery measuring 34 mm suggestive of pulmonary
hypertension.

ADDENDUM:
OVER-READ INTERPRETATION  CT CHEST

The following report is an over-read performed by radiologist Dr.
does not include interpretation of cardiac or coronary anatomy or
pathology. The cardiac CTA interpretation by the cardiologist is
attached.
FINDINGS: Vascular: Normal aortic caliber. No central pulmonary embolism, on
this non-dedicated study.

Mediastinum/Nodes: No imaged thoracic adenopathy.

Lungs/Pleura: Trace bilateral pleural fluid or thickening. Clear
imaged lungs.

Upper Abdomen: Normal imaged portions of the liver, spleen, stomach.

Musculoskeletal: Moderate thoracic spondylosis.
IMPRESSION: 1.  No acute findings in the imaged extracardiac chest.
2. Trace bilateral pleural fluid or thickening.

*** End of Addendum ***

## 2020-05-29 DIAGNOSIS — F319 Bipolar disorder, unspecified: Secondary | ICD-10-CM | POA: Diagnosis not present

## 2020-05-29 DIAGNOSIS — Z79899 Other long term (current) drug therapy: Secondary | ICD-10-CM | POA: Diagnosis not present

## 2020-05-30 LAB — BASIC METABOLIC PANEL
BUN/Creatinine Ratio: 6 (calc) (ref 6–22)
BUN: 8 mg/dL (ref 7–25)
CO2: 26 mmol/L (ref 20–32)
Calcium: 10.3 mg/dL (ref 8.6–10.3)
Chloride: 102 mmol/L (ref 98–110)
Creat: 1.34 mg/dL — ABNORMAL HIGH (ref 0.70–1.33)
Glucose, Bld: 116 mg/dL (ref 65–139)
Potassium: 4 mmol/L (ref 3.5–5.3)
Sodium: 138 mmol/L (ref 135–146)

## 2020-05-30 LAB — LITHIUM LEVEL: Lithium Lvl: 1 mmol/L (ref 0.6–1.2)

## 2020-06-04 ENCOUNTER — Other Ambulatory Visit (HOSPITAL_COMMUNITY): Payer: Self-pay | Admitting: Urology

## 2020-06-04 MED FILL — ALFUZOSIN HCL ER 10 MG TB24: 10 | 90 days supply | Qty: 180 | Fill #0

## 2020-06-14 ENCOUNTER — Telehealth: Payer: Self-pay | Admitting: Psychiatry

## 2020-06-14 NOTE — Telephone Encounter (Signed)
Pt wants to know if you can call him when you get a chance. Pt said that he is feeling unstable. Please call [phone].

## 2020-06-20 NOTE — Telephone Encounter (Signed)
Pt called to report he's not doing very well. Feels he's going back to old bipolar behavior. More manic than he would like. Making bad decisions. Moved apt up and on canc list. Ask for a possible med adjustment before apt. [phone]

## 2020-06-21 ENCOUNTER — Other Ambulatory Visit: Payer: Self-pay | Admitting: Psychiatry

## 2020-06-21 DIAGNOSIS — F319 Bipolar disorder, unspecified: Secondary | ICD-10-CM

## 2020-06-21 MED ORDER — QUETIAPINE FUMARATE 300 MG PO TABS: 300.0000 mg | ORAL_TABLET | Freq: Every day | ORAL | 0 refills | Status: DC

## 2020-06-21 MED FILL — QUETIAPINE FUMARATE 300 MG: 300 | 30 days supply | Qty: 30 | Fill #0

## 2020-06-21 NOTE — Telephone Encounter (Signed)
Return telephone call to patient: Head information indirectly from his wife that the patient is manic. Patient reports only 1 hour of sleep last night, impulsive somewhat reckless decision making with poor concentration.  He feels revved up even and is up in his body at night.  He has been compliant with medications. No psychotic symptoms and no suicidal thoughts or homicidal thoughts.  Discussed that the patient is having manic symptoms that are breaking through despite adequate lithium level of 1.0 just a few days ago.  Therefore going up and lithium is not a good option.  He is taking quetiapine 50 mg nightly.  Discussed the pros and cons of increasing quetiapine to manage this acute mania versus adding Depakote which is the other more common mood stabilizer.  It is likely that increasing quetiapine will work quicker because he will start sleeping better almost right away probably.  Discussed the dosing range of Seroquel for mania is 300 to 800 mg nightly.  Discussed the side effects of Seroquel. Increase Seroquel to 300 mg nightly and continue lithium. Schedule follow-up soon We will discussed the option of transitioning him to Depakote as an alternative to the lithium at his appointment.  He agrees with the plan.  Lynder Parents, MD, DFAPA

## 2020-06-24 MED FILL — VYVANSE 40 MG CAPSULE: 40 | 90 days supply | Qty: 90 | Fill #0

## 2020-07-11 ENCOUNTER — Encounter: Payer: Self-pay | Admitting: Psychiatry

## 2020-07-11 ENCOUNTER — Ambulatory Visit (INDEPENDENT_AMBULATORY_CARE_PROVIDER_SITE_OTHER): Payer: 59 | Admitting: Psychiatry

## 2020-07-11 ENCOUNTER — Other Ambulatory Visit: Payer: Self-pay

## 2020-07-11 ENCOUNTER — Other Ambulatory Visit: Payer: Self-pay | Admitting: Psychiatry

## 2020-07-11 VITALS — BP 128/81 | HR 74

## 2020-07-11 DIAGNOSIS — F3110 Bipolar disorder, current episode manic without psychotic features, unspecified: Secondary | ICD-10-CM | POA: Diagnosis not present

## 2020-07-11 DIAGNOSIS — F902 Attention-deficit hyperactivity disorder, combined type: Secondary | ICD-10-CM

## 2020-07-11 DIAGNOSIS — F319 Bipolar disorder, unspecified: Secondary | ICD-10-CM

## 2020-07-11 DIAGNOSIS — Z79899 Other long term (current) drug therapy: Secondary | ICD-10-CM

## 2020-07-11 MED ORDER — QUETIAPINE FUMARATE 300 MG PO TABS: 300.0000 mg | ORAL_TABLET | Freq: Every day | ORAL | 0 refills | Status: DC

## 2020-07-11 MED ORDER — LISDEXAMFETAMINE DIMESYLATE 50 MG PO CAPS: 50.0000 mg | ORAL_CAPSULE | ORAL | 0 refills | Status: DC

## 2020-07-11 NOTE — Progress Notes (Signed)
Corey Reilly [id number] 09-Oct-1960 60 y.o.  Subjective:   Patient ID:  Corey Reilly is a 60 y.o. (DOB 14-May-1960) male.  Chief Complaint:  Chief Complaint  Patient presents with  . Follow-up  . Bipolar I disorder ([place])  . Manic Behavior    Depression        Associated symptoms include decreased concentration.  Associated symptoms include no suicidal ideas.  [name] presents to the office today for follow-up of bipolar disorder and concerns about focus and possible ADD.  When seen December 2019.  No meds were changed.  Labs were received with low normal lithium level.  Creatinine was slightly elevated at 1.4.  When seen in June.  No meds were changed.  Was recommended that he try NAC 600 mg daily for cognition.  Completed ADHD self questionnaire which was high on both the inattention and hyperactivity scales.  Tried NAC 1000 mg with less memory issues but not dramatic.  Does think it's improved but not enough and wanted to pursue a trial of stimulants.  This was discussed with his wife who noted the following: Periods of poor function intermittently.  Gets distracted and then forgets.  Cannot control it.  Wonders if it's ADD.  D dx ADHD and the 2 of them are similar and she is on Adderall.  At [employer].  Constantly distracted.  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  Therefore at the visit in August we initiated a trial of Vyvanse 30 mg daily. He called back September 3 asking for refills stating it was working well.  He did increase Vyvanse to 40 mg daily.  seen February 06, 2019.  Rec trial NAC 1000 mg for STM and continue Vyvanse 30 as best tolerated dose.  No other med changes.  Overall doing OK.  Expected stress with Covid and politics.  Mental health is OK. Doesn't feel the Vyvanse in any kick or mood but focus is definitely better.  Last month is best month ever and much mor productive and efficient.  Lost a little weight on it.  SE low appetite so skips  on the weekend.  Less distractible with Vyvanse and only uses work days.    seen May 09, 2019.  No meds were changed.  08/07/19 appt with the following noted: He increased the lithium dose back to 900 mg daily about first of April, because he was having some manic and depressive symptoms.  Lithium level requested was 0.6 at lower dosage of 675 mg daily.. Mood the last few days is OK.  Dealing with some pain in TMJ that is affecting him. 2 nights of no sleep but otherwise normal sleep. Has used more caffeine than usual but only in the morning.    Patient reports stable mood and denies depressed or irritable moods.  Patient denies any recent difficulty with anxiety.  Patient denies difficulty with sleep initiation or maintenance. Denies appetite disturbance.  Patient reports that energy and motivation have been good.  Patient denies any difficulty with concentration.  Patient denies any suicidal ideation. Plan: He is not manic and no longer depressed after increase lithium to 900 mg daily.  stable even with Vyvanse and doing well with lithium  12/06/19 appt with the following noted: All in all well.  Not sign manic.  Stayed on lithium 900.  Sleep good on 50 mg daily 7-8 hours.  No SE except  Not hungry on Vyvanse.  05/08/2020 appt noted: Mood OK overall.  But got  worked up too much last week for about 3 hours but it only lasted those hours. Some problems with sleep without apparent reason though occ forgets it. Sometimes a night with no sleep 1-2 times a month.  Usually good sleep routine. Melatonin and benadryl  Health is good. Plan: Get labs as soon as possible  06/14/2020 phone call with patient:  Patient called because of emerging mania. MD response: Return telephone call to patient: Had information indirectly from his wife that the patient is manic. Patient reports only 1 hour of sleep last night, impulsive somewhat reckless decision making with poor concentration.  He feels revved up  even and is up in his body at night.  He has been compliant with medications. No psychotic symptoms and no suicidal thoughts or homicidal thoughts. Discussed that the patient is having manic symptoms that are breaking through despite adequate lithium level of 1.0 just a few days ago.  Therefore going up and lithium is not a good option.  He is taking quetiapine 50 mg nightly.  Discussed the pros and cons of increasing quetiapine to manage this acute mania versus adding Depakote which is the other more common mood stabilizer.  It is likely that increasing quetiapine will work quicker because he will start sleeping better almost right away probably.  Discussed the dosing range of Seroquel for mania is 300 to 800 mg nightly.  Discussed the side effects of Seroquel. Increase Seroquel to 300 mg nightly and continue lithium. Schedule follow-up soon We will discussed the option of transitioning him to Depakote as an alternative to the lithium at his appointment.  07/11/2020 appointment with the following noted: Did the job with mood stab ilizing.  But was pretty hungover and reduced the dose to 150 mg HS and it seems fine.  Denies any manic sx and doesn't think wife sees him manic now.  Sleep is fine about 7-8 hours.   SE a bit harder getting up and going int he morning.  No clear triggers. Wife Dyann Ruddle thought his manic sx lasted about 4 weeks with volatility and difficulty functioning with work.  Hyperverbal and overinclusive and hypergraphia.  Noticed by others but resolved.  Couldn't stop himself.  Didn't really recognize mania until pointed out to him.  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 D doesn't drink bc pt has hx of alcohol dependence.  He's been sober for many years.  Attention problems.  Talked to wife.    Past psychiatric medication trials include Geodon which caused sedation, Zyprexa which caused sedation, Latuda which caused akathisia and Seroquel Found out Aunt  has bipolar disabled.  Review of Systems:  Review of Systems  HENT: Negative for dental problem.   Cardiovascular: Negative for chest pain and palpitations.  Skin: Positive for rash.  Neurological: Negative for dizziness, tremors and weakness.  Psychiatric/Behavioral: Positive for decreased concentration and depression. Negative for agitation, behavioral problems, confusion, dysphoric mood, hallucinations, self-injury, sleep disturbance and suicidal ideas. The patient is not nervous/anxious and is not hyperactive.     Medications: I have reviewed the patient's current medications.  Current Outpatient Medications  Medication Sig Dispense Refill  . Adalimumab (HUMIRA PEN) 40 MG/0.8ML PNKT Inject 40 mg into the skin every 14 (fourteen) days. 2 each 3  . alfuzosin (UROXATRAL) 10 MG 24 hr tablet Take 10 mg by mouth daily with breakfast.    . atorvastatin (LIPITOR) 20 MG tablet Take 10 mg by mouth daily.  11  .  lithium carbonate (ESKALITH) 450 MG CR tablet Take 1 tablet (450 mg total) by mouth 2 (two) times daily. 180 tablet 1  . lisdexamfetamine (VYVANSE) 50 MG capsule Take 1 capsule (50 mg total) by mouth every morning. 90 capsule 0  . QUEtiapine (SEROQUEL) 300 MG tablet Take 1 tablet (300 mg total) by mouth at bedtime. 90 tablet 0   No current facility-administered medications for this visit.    Medication Side Effects: None  Allergies: No Known Allergies  Past Medical History:  Diagnosis Date  . Bipolar disorder ([place])   . Depression   . Psoriasis   . Ulcer     Family History  Problem Relation Age of Onset  . Hyperlipidemia Father   . COPD Maternal Uncle     Social History   Socioeconomic History  . Marital status: Married    Spouse name: Not on file  . Number of children: Not on file  . Years of education: Not on file  . Highest education level: Not on file  Occupational History  . Not on file  Tobacco Use  . Smoking status: Never Smoker  . Smokeless tobacco:  Never Used  Substance and Sexual Activity  . Alcohol use: Not on file  . Drug use: Not on file  . Sexual activity: Not on file  Other Topics Concern  . Not on file  Social History Narrative  . Not on file   Social Determinants of Health   Financial Resource Strain: Not on file  Food Insecurity: Not on file  Transportation Needs: Not on file  Physical Activity: Not on file  Stress: Not on file  Social Connections: Not on file  Intimate Partner Violence: Not on file    Past Medical History, Surgical history, Social history, and Family history were reviewed and updated as appropriate.   Please see review of systems for further details on the patient's review from today.   Objective:   Physical Exam:  BP 128/81   Pulse 74   Physical Exam Constitutional:      General: He is not in acute distress. Musculoskeletal:        General: No deformity.  Neurological:     Mental Status: He is alert and oriented to person, place, and time.     Cranial Nerves: No dysarthria.     Coordination: Coordination normal.  Psychiatric:        Attention and Perception: Attention and perception normal. He does not perceive auditory or visual hallucinations.        Mood and Affect: Mood normal. Mood is not anxious, depressed or elated. Affect is not labile, blunt, angry or inappropriate.        Speech: Speech normal. Speech is not rapid and pressured.        Behavior: Behavior normal. Behavior is not agitated. Behavior is cooperative.        Thought Content: Thought content normal. Thought content is not paranoid or delusional. Thought content does not include homicidal or suicidal ideation. Thought content does not include homicidal or suicidal plan.        Cognition and Memory: Cognition and memory normal.        Judgment: Judgment normal.     Comments: Insight intact     Lab Review:     Component Value Date/Time   NA 138 05/29/2020 1201   K 4.0 05/29/2020 1201   CL 102 05/29/2020 1201    CO2 26 05/29/2020 1201   GLUCOSE 116 05/29/2020 1201  BUN 8 05/29/2020 1201   CREATININE 1.34 (H) 05/29/2020 1201   CALCIUM 10.3 05/29/2020 1201   PROT 8.3 05/21/2011 1753   ALBUMIN 4.4 05/21/2011 1753   AST 48 (H) 05/21/2011 1753   ALT 37 05/21/2011 1753   ALKPHOS 87 05/21/2011 1753   BILITOT 2.5 (H) 05/21/2011 1753   GFRNONAA 79 (L) 05/21/2011 1753   GFRAA >90 05/21/2011 1753       Component Value Date/Time   WBC 19.2 (H) 05/21/2011 1753   RBC 4.91 05/21/2011 1753   HGB 16.7 05/21/2011 1753   HCT 46.1 05/21/2011 1753   PLT 216 05/21/2011 1753   MCV 93.9 05/21/2011 1753   MCH 34.0 05/21/2011 1753   MCHC 36.2 (H) 05/21/2011 1753   RDW 13.0 05/21/2011 1753   LYMPHSABS 0.8 05/21/2011 1753   MONOABS 1.2 (H) 05/21/2011 1753   EOSABS 0.0 05/21/2011 1753   BASOSABS 0.0 05/21/2011 1753   lithium level October 21, 2017 0.4.  This was on 675 mg a day same his current dosage.  His level in November 18 was 1.10 900 mg a day.  The dosage was reduced because of elevated creatinine which in June was 1.53. Lithium Lvl  Date Value Ref Range Status  05/29/2020 1.0 0.6 - 1.2 mmol/L Final   This was on 900 mg lithium daily.  No results found for: PHENYTOIN, PHENOBARB, VALPROATE, CBMZ   Adult self-report scale for ADD score was 29 on inattentive side which is high and 23 on the hyperactive scale which is borderline high suggestive of ADHD  .res Assessment: Plan:    Glenda was seen today for follow-up, bipolar i disorder ([facility]cc) and manic behavior.  Diagnoses and all orders for this visit:  Bipolar I disorder, most recent episode (or current) manic ([place])  Attention deficit hyperactivity disorder (ADHD), combined type -     lisdexamfetamine (VYVANSE) 50 MG capsule; Take 1 capsule (50 mg total) by mouth every morning.  Lithium use  Bipolar I disorder ([facility]) -     QUEtiapine (SEROQUEL) 300 MG tablet; Take 1 tablet (300 mg total) by mouth at bedtime.   Bipolar disorder under good  control with lithium.  Check lithium levels and BMP.  Hx borderline creatinine. Counseled patient regarding potential benefits, risks, and side effects of lithium to include potential risk of lithium affecting thyroid and renal function.  Discussed need for periodic lab monitoring to determine drug level and to assess for potential adverse effects.  Counseled patient regarding signs and symptoms of lithium toxicity and advised that they notify office immediately or seek urgent medical attention if experiencing these signs and symptoms.  Patient advised to contact office with any questions or concerns.  05/29/2020 lithium level stable 1.0. BMP better CR.  Needed increase Seroquel to 300 mg HS for recent mania not prevented by lithium . not depressed after increase lithium to 900 mg daily.    Discussed his high score on the ADHD self questionnaire.  He has a daughter with ADD.  He feels this is interfering with his job function and quality of life and would like to try treatment.  We discussed stimulant and non-stimulant options for treatment of ADHD.  Discussed the risk that some of these treatments could trigger mania and a bipolar patient.  Discussed side effects in detail.  Discussed the differences between types of stimulants including methylphenidate and amphetamine based.  Also discussed the differences between short acting and long-acting stimulants and generally long-acting are preferred in adults.  Discussed  this again at this visit because his daughter takes Adderall and he wanted to know the difference.  Discussed potential addiction risk.  Discussed potential benefits, risks, and side effects of stimulants with patient to include increased heart rate, palpitations, insomnia, increased anxiety, increased irritability, or decreased appetite.  Instructed patient to contact office if experiencing any significant tolerability issues.  OK increase Vyvanse 50 mg daily. Good responsel.  We will proceed  cautiously but may increase if there is no benefit at the lower dosages.  He is only taking it on workdays and on heavy work days occasionally doubles up.  Discussed the risk with this because that is higher than the usual recommended dose.  However he is not taking it at all on weekends or days that he is not working.  Productivity better and better on task with Vyvanse 40. Once a month or so doubles up.  This appt was 30 mins.  FU 3  mos   Lynder Parents, MD, DFAPA   Please see After Visit Summary for patient specific instructions.  Future Appointments  Date Time Provider [place]  11/05/2020 11:00 AM Cottle, Billey Co., MD [facility] None    No orders of the defined types were placed in this encounter.     -------------------------------

## 2020-07-15 MED FILL — QUETIAPINE FUMARATE 300 MG: 300 | 90 days supply | Qty: 90 | Fill #0

## 2020-07-17 MED FILL — LITHIUM ER 450 MG TABLET: 450 | 90 days supply | Qty: 180 | Fill #0

## 2020-07-23 ENCOUNTER — Other Ambulatory Visit (HOSPITAL_COMMUNITY): Payer: Self-pay

## 2020-07-23 ENCOUNTER — Encounter: Payer: Self-pay | Admitting: Psychiatry

## 2020-07-31 ENCOUNTER — Other Ambulatory Visit (HOSPITAL_COMMUNITY): Payer: Self-pay

## 2020-08-01 ENCOUNTER — Other Ambulatory Visit (HOSPITAL_COMMUNITY): Payer: Self-pay

## 2020-08-06 ENCOUNTER — Telehealth: Payer: Self-pay | Admitting: Pharmacist

## 2020-08-06 NOTE — Telephone Encounter (Signed)
Called patient to schedule an appointment for the [facility]. I was unable to reach the patient so I left a HIPAA-compliant message requesting that the patient return my call.   Luke Van Ausdall, PharmD, BCACP, CPP Clinical Pharmacist [facility] [phone]  

## 2020-08-08 ENCOUNTER — Ambulatory Visit: Payer: 59 | Attending: Family Medicine | Admitting: Pharmacist

## 2020-08-08 ENCOUNTER — Other Ambulatory Visit: Payer: Self-pay

## 2020-08-08 DIAGNOSIS — Z79899 Other long term (current) drug therapy: Secondary | ICD-10-CM

## 2020-08-08 NOTE — Progress Notes (Signed)
S: Patient presents today to the [facility].  Patient is currently taking Humira for psoriasis. Patient is managed by Dr. Denna Haggard for this.   Efficacy: still continues to work well.  Adherence: confirmed   Dosing:  Plaque psoriasis: SubQ: Maintenance: 40 mg every other week   Drug-drug interactions: none  Screening: TB test: completed per patient Hepatitis: completed  Monitoring: S/sx of infection: denies CBC: WNL per patient S/sx of hypersensitivity: denies S/sx of malignancy: denies S/sx of heart failure: denies  O:     Lab Results  Component Value Date   WBC 19.2 (H) 05/21/2011   HGB 16.7 05/21/2011   HCT 46.1 05/21/2011   MCV 93.9 05/21/2011   PLT 216 05/21/2011      Chemistry      Component Value Date/Time   NA 138 05/29/2020 1201   K 4.0 05/29/2020 1201   CL 102 05/29/2020 1201   CO2 26 05/29/2020 1201   BUN 8 05/29/2020 1201   CREATININE 1.34 (H) 05/29/2020 1201      Component Value Date/Time   CALCIUM 10.3 05/29/2020 1201   ALKPHOS 87 05/21/2011 1753   AST 48 (H) 05/21/2011 1753   ALT 37 05/21/2011 1753   BILITOT 2.5 (H) 05/21/2011 1753      A/P: 1. Medication review: Patient on Humira for psoriasis and continues to do well on it. Reviewed the medication with the patient, including the following: Humira is a TNF blocking agent indicated for ankylosing spondylitis, Crohn's disease, Hidradenitis suppurativa, psoriatic arthritis, plaque psoriasis, ulcerative colitis, and uveitis. The most common adverse effects are infections, headache, and injection site reactions. There is the possibility of an increased risk of malignancy but it is not well understood if this increased risk is due to there medication or the disease state. There are rare cases of pancytopenia and aplastic anemia. No recommendations for changes.   Benard Halsted, PharmD, [place], [employer] [phone]

## 2020-08-12 MED FILL — Atorvastatin Calcium Tab 20 MG (Base Equivalent): ORAL | 90 days supply | Qty: 90 | Fill #0 | Status: AC

## 2020-08-13 ENCOUNTER — Other Ambulatory Visit (HOSPITAL_COMMUNITY): Payer: Self-pay

## 2020-08-19 ENCOUNTER — Other Ambulatory Visit (HOSPITAL_COMMUNITY): Payer: Self-pay

## 2020-08-19 MED ORDER — AMOXICILLIN 500 MG PO CAPS
ORAL_CAPSULE | ORAL | 0 refills | Status: DC
  Filled 2020-08-19: qty 21, 7d supply, fill #0

## 2020-08-28 ENCOUNTER — Other Ambulatory Visit (HOSPITAL_COMMUNITY): Payer: Self-pay

## 2020-08-28 MED FILL — Adalimumab Auto-injector Kit 40 MG/0.8ML: SUBCUTANEOUS | 28 days supply | Qty: 2 | Fill #0 | Status: AC

## 2020-09-02 ENCOUNTER — Other Ambulatory Visit (HOSPITAL_COMMUNITY): Payer: Self-pay

## 2020-09-07 ENCOUNTER — Other Ambulatory Visit: Payer: Self-pay | Admitting: Urology

## 2020-09-10 ENCOUNTER — Other Ambulatory Visit (HOSPITAL_COMMUNITY): Payer: Self-pay

## 2020-09-10 MED ORDER — ALFUZOSIN HCL ER 10 MG PO TB24
10.0000 mg | ORAL_TABLET | Freq: Two times a day (BID) | ORAL | 0 refills | Status: DC
  Filled 2020-09-10: qty 180, 90d supply, fill #0

## 2020-09-16 ENCOUNTER — Other Ambulatory Visit (HOSPITAL_COMMUNITY): Payer: Self-pay

## 2020-09-18 ENCOUNTER — Other Ambulatory Visit (HOSPITAL_COMMUNITY): Payer: Self-pay

## 2020-09-18 MED FILL — Lisdexamfetamine Dimesylate Cap 50 MG: ORAL | 90 days supply | Qty: 90 | Fill #0 | Status: AC

## 2020-09-27 ENCOUNTER — Other Ambulatory Visit (HOSPITAL_COMMUNITY): Payer: Self-pay

## 2020-09-27 MED FILL — Adalimumab Auto-injector Kit 40 MG/0.8ML: SUBCUTANEOUS | 28 days supply | Qty: 2 | Fill #1 | Status: CN

## 2020-10-07 ENCOUNTER — Other Ambulatory Visit (HOSPITAL_COMMUNITY): Payer: Self-pay

## 2020-10-07 DIAGNOSIS — H52223 Regular astigmatism, bilateral: Secondary | ICD-10-CM | POA: Diagnosis not present

## 2020-10-07 DIAGNOSIS — H524 Presbyopia: Secondary | ICD-10-CM | POA: Diagnosis not present

## 2020-10-07 DIAGNOSIS — H5213 Myopia, bilateral: Secondary | ICD-10-CM | POA: Diagnosis not present

## 2020-10-09 ENCOUNTER — Telehealth: Payer: Self-pay | Admitting: *Deleted

## 2020-10-09 NOTE — Telephone Encounter (Signed)
Received prior authorization for Humira- not done patient due office visit and labs.

## 2020-10-15 ENCOUNTER — Other Ambulatory Visit (HOSPITAL_COMMUNITY): Payer: Self-pay

## 2020-10-15 MED FILL — Lithium Carbonate Tab ER 450 MG: ORAL | 90 days supply | Qty: 180 | Fill #0 | Status: AC

## 2020-10-15 MED FILL — Adalimumab Auto-injector Kit 40 MG/0.8ML: SUBCUTANEOUS | 28 days supply | Qty: 2 | Fill #1 | Status: CN

## 2020-10-16 ENCOUNTER — Other Ambulatory Visit (HOSPITAL_COMMUNITY): Payer: Self-pay

## 2020-10-18 ENCOUNTER — Other Ambulatory Visit (HOSPITAL_COMMUNITY): Payer: Self-pay

## 2020-10-18 MED FILL — Adalimumab Auto-injector Kit 40 MG/0.8ML: SUBCUTANEOUS | 28 days supply | Qty: 2 | Fill #1 | Status: AC

## 2020-11-05 ENCOUNTER — Ambulatory Visit (INDEPENDENT_AMBULATORY_CARE_PROVIDER_SITE_OTHER): Payer: 59 | Admitting: Psychiatry

## 2020-11-05 ENCOUNTER — Other Ambulatory Visit: Payer: Self-pay

## 2020-11-05 ENCOUNTER — Other Ambulatory Visit (HOSPITAL_COMMUNITY): Payer: Self-pay

## 2020-11-05 ENCOUNTER — Encounter: Payer: Self-pay | Admitting: Psychiatry

## 2020-11-05 VITALS — BP 128/88 | HR 66

## 2020-11-05 DIAGNOSIS — F902 Attention-deficit hyperactivity disorder, combined type: Secondary | ICD-10-CM | POA: Diagnosis not present

## 2020-11-05 DIAGNOSIS — F319 Bipolar disorder, unspecified: Secondary | ICD-10-CM | POA: Diagnosis not present

## 2020-11-05 DIAGNOSIS — F3110 Bipolar disorder, current episode manic without psychotic features, unspecified: Secondary | ICD-10-CM | POA: Diagnosis not present

## 2020-11-05 DIAGNOSIS — Z79899 Other long term (current) drug therapy: Secondary | ICD-10-CM

## 2020-11-05 MED ORDER — QUETIAPINE FUMARATE 50 MG PO TABS
150.0000 mg | ORAL_TABLET | Freq: Every day | ORAL | 1 refills | Status: DC
Start: 2020-11-05 — End: 2021-05-12
  Filled 2020-11-05 – 2021-05-08 (×2): qty 270, 90d supply, fill #0

## 2020-11-05 MED FILL — Atorvastatin Calcium Tab 20 MG (Base Equivalent): ORAL | 90 days supply | Qty: 90 | Fill #1 | Status: AC

## 2020-11-05 NOTE — Progress Notes (Signed)
[name] [id number] April 09, 1961 60 y.o.  Subjective:   Patient ID:  Corey Reilly is a 60 y.o. (DOB 12/22/1960) male.  Chief Complaint:  Chief Complaint  Patient presents with   Follow-up   Bipolar I disorder ([place])    Depression        Associated symptoms include decreased concentration.  Associated symptoms include no suicidal ideas. Corey Reilly presents to the office today for follow-up of bipolar disorder and concerns about focus and possible ADD.  When seen December 2019.  No meds were changed.  Labs were received with low normal lithium level.  Creatinine was slightly elevated at 1.4.  When seen in June.  No meds were changed.  Was recommended that he try NAC 600 mg daily for cognition.  Completed ADHD self questionnaire which was high on both the inattention and hyperactivity scales.  Tried NAC 1000 mg with less memory issues but not dramatic.  Does think it's improved but not enough and wanted to pursue a trial of stimulants.  This was discussed with his wife who noted the following: Periods of poor function intermittently.  Gets distracted and then forgets.  Cannot control it.  Wonders if it's ADD.  D dx ADHD and the 2 of them are similar and she is on Adderall.  At [employer].  Constantly distracted.  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  Therefore at the visit in August we initiated a trial of Vyvanse 30 mg daily. He called back September 3 asking for refills stating it was working well.  He did increase Vyvanse to 40 mg daily.  seen February 06, 2019.  Rec trial NAC 1000 mg for STM and continue Vyvanse 30 as best tolerated dose.  No other med changes.  Overall doing OK.  Expected stress with Covid and politics.  Mental health is OK. Doesn't feel the Vyvanse in any kick or mood but focus is definitely better.  Last month is best month ever and much mor productive and efficient.  Lost a little weight on it.  SE low appetite so skips on the weekend.  Less  distractible with Vyvanse and only uses work days.    seen May 09, 2019.  No meds were changed.  08/07/19 appt with the following noted: He increased the lithium dose back to 900 mg daily about first of April, because he was having some manic and depressive symptoms.  Lithium level requested was 0.6 at lower dosage of 675 mg daily.. Mood the last few days is OK.  Dealing with some pain in TMJ that is affecting him. 2 nights of no sleep but otherwise normal sleep. Has used more caffeine than usual but only in the morning.    Patient reports stable mood and denies depressed or irritable moods.  Patient denies any recent difficulty with anxiety.  Patient denies difficulty with sleep initiation or maintenance. Denies appetite disturbance.  Patient reports that energy and motivation have been good.  Patient denies any difficulty with concentration.  Patient denies any suicidal ideation. Plan: He is not manic and no longer depressed after increase lithium to 900 mg daily.  stable even with Vyvanse and doing well with lithium  12/06/19 appt with the following noted: All in all well.  Not sign manic.  Stayed on lithium 900.  Sleep good on 50 mg daily 7-8 hours.  No SE except  Not hungry on Vyvanse.  05/08/2020 appt noted: Mood OK overall.  But got worked up too much last  week for about 3 hours but it only lasted those hours. Some problems with sleep without apparent reason though occ forgets it. Sometimes a night with no sleep 1-2 times a month.  Usually good sleep routine. Melatonin and benadryl  Health is good. Plan: Get labs as soon as possible  06/14/2020 phone call with patient:  Patient called because of emerging mania. MD response: Return telephone call to patient: Had information indirectly from his wife that the patient is manic. Patient reports only 1 hour of sleep last night, impulsive somewhat reckless decision making with poor concentration.  He feels revved up even and is up in his  body at night.  He has been compliant with medications. No psychotic symptoms and no suicidal thoughts or homicidal thoughts. Discussed that the patient is having manic symptoms that are breaking through despite adequate lithium level of 1.0 just a few days ago.  Therefore going up and lithium is not a good option.  He is taking quetiapine 50 mg nightly.  Discussed the pros and cons of increasing quetiapine to manage this acute mania versus adding Depakote which is the other more common mood stabilizer.  It is likely that increasing quetiapine will work quicker because he will start sleeping better almost right away probably.  Discussed the dosing range of Seroquel for mania is 300 to 800 mg nightly.  Discussed the side effects of Seroquel. Increase Seroquel to 300 mg nightly and continue lithium. Schedule follow-up soon We will discussed the option of transitioning him to Depakote as an alternative to the lithium at his appointment.  07/11/2020 appointment with the following noted:  Did the job with mood stab ilizing.  But was pretty hungover and reduced the dose to 150 mg HS and it seems fine.  Denies any manic sx and doesn't think wife sees him manic now.  Sleep is fine about 7-8 hours.   SE a bit harder getting up and going int he morning.  No clear triggers. Wife Corey Reilly thought his manic sx lasted about 4 weeks with volatility and difficulty functioning with work.  Hyperverbal and overinclusive and hypergraphia.  Noticed by others but resolved.  Couldn't stop himself.  Didn't really recognize mania until pointed out to him. Plan: OK increase Vyvanse 50 mg daily.   11/05/2020 appointment with the following noted: Doing well overall home and work and life fine.  Vyvanse helpful.   Continue meds.  Tried microdosing mushrooms over 6 weeks and it seemed to help.  Helped him be a better person.     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 D doesn't drink bc pt has hx of  alcohol dependence.  He's been sober for many years.  Attention problems.  Talked to wife.    Past psychiatric medication trials include Geodon which caused sedation, Zyprexa which caused sedation, Latuda which caused akathisia and Seroquel Vyvanse Found out Aunt has bipolar disabled.  Review of Systems:  Review of Systems  HENT:  Negative for dental problem.   Cardiovascular:  Negative for chest pain and palpitations.  Skin:  Positive for rash.  Neurological:  Negative for dizziness, tremors, weakness and light-headedness.  Psychiatric/Behavioral:  Positive for decreased concentration. Negative for agitation, behavioral problems, confusion, dysphoric mood, hallucinations, self-injury, sleep disturbance and suicidal ideas. The patient is not nervous/anxious and is not hyperactive.    Medications: I have reviewed the patient's current medications.  Current Outpatient Medications  Medication Sig Dispense Refill   Adalimumab  40 MG/0.8ML PNKT INJECT 40 MG INTO THE SKIN EVERY 14 DAYS. 2 each 3   alfuzosin (UROXATRAL) 10 MG 24 hr tablet Take 10 mg by mouth daily with breakfast.     alfuzosin (UROXATRAL) 10 MG 24 hr tablet Take 1 tablet (10 mg total) by mouth 2 (two) times daily. 180 tablet 0   atorvastatin (LIPITOR) 20 MG tablet Take 10 mg by mouth daily.  11   atorvastatin (LIPITOR) 20 MG tablet TAKE 1 TABLET BY MOUTH ONCE A DAY 90 tablet 3   lisdexamfetamine (VYVANSE) 50 MG capsule TAKE 1 CAPSULE (50 MG TOTAL) BY MOUTH EVERY MORNING. 90 capsule 0   lithium carbonate (ESKALITH) 450 MG CR tablet TAKE 1 TABLET BY MOUTH 2 TIMES DAILY 180 tablet 1   QUEtiapine (SEROQUEL) 50 MG tablet Take 3 tablets (150 mg total) by mouth at bedtime. 270 tablet 1   No current facility-administered medications for this visit.    Medication Side Effects: None  Allergies: No Known Allergies  Past Medical History:  Diagnosis Date   Bipolar disorder ([place])    Depression    Psoriasis    Ulcer     Family  History  Problem Relation Age of Onset   Hyperlipidemia Father    COPD Maternal Uncle     Social History   Socioeconomic History   Marital status: Married    Spouse name: Not on file   Number of children: Not on file   Years of education: Not on file   Highest education level: Not on file  Occupational History   Not on file  Tobacco Use   Smoking status: Never   Smokeless tobacco: Never  Substance and Sexual Activity   Alcohol use: Not on file   Drug use: Not on file   Sexual activity: Not on file  Other Topics Concern   Not on file  Social History Narrative   Not on file   Social Determinants of Health   Financial Resource Strain: Not on file  Food Insecurity: Not on file  Transportation Needs: Not on file  Physical Activity: Not on file  Stress: Not on file  Social Connections: Not on file  Intimate Partner Violence: Not on file    Past Medical History, Surgical history, Social history, and Family history were reviewed and updated as appropriate.   Please see review of systems for further details on the patient's review from today.   Objective:   Physical Exam:  BP 128/88   Pulse 66   Physical Exam Constitutional:      General: He is not in acute distress. Musculoskeletal:        General: No deformity.  Neurological:     Mental Status: He is alert and oriented to person, place, and time.     Cranial Nerves: No dysarthria.     Coordination: Coordination normal.  Psychiatric:        Attention and Perception: Attention and perception normal. He does not perceive auditory or visual hallucinations.        Mood and Affect: Mood normal. Mood is not anxious, depressed or elated. Affect is not labile, blunt or angry.        Speech: Speech normal. Speech is not rapid and pressured.        Behavior: Behavior normal. Behavior is not agitated. Behavior is cooperative.        Thought Content: Thought content normal. Thought content is not paranoid or delusional.  Thought content does not include homicidal  or suicidal ideation. Thought content does not include homicidal or suicidal plan.        Cognition and Memory: Cognition and memory normal.        Judgment: Judgment normal.     Comments: Insight intact    Lab Review:     Component Value Date/Time   NA 138 05/29/2020 1201   K 4.0 05/29/2020 1201   CL 102 05/29/2020 1201   CO2 26 05/29/2020 1201   GLUCOSE 116 05/29/2020 1201   BUN 8 05/29/2020 1201   CREATININE 1.34 (H) 05/29/2020 1201   CALCIUM 10.3 05/29/2020 1201   PROT 8.3 05/21/2011 1753   ALBUMIN 4.4 05/21/2011 1753   AST 48 (H) 05/21/2011 1753   ALT 37 05/21/2011 1753   ALKPHOS 87 05/21/2011 1753   BILITOT 2.5 (H) 05/21/2011 1753   GFRNONAA 79 (L) 05/21/2011 1753   GFRAA >90 05/21/2011 1753       Component Value Date/Time   WBC 19.2 (H) 05/21/2011 1753   RBC 4.91 05/21/2011 1753   HGB 16.7 05/21/2011 1753   HCT 46.1 05/21/2011 1753   PLT 216 05/21/2011 1753   MCV 93.9 05/21/2011 1753   MCH 34.0 05/21/2011 1753   MCHC 36.2 (H) 05/21/2011 1753   RDW 13.0 05/21/2011 1753   LYMPHSABS 0.8 05/21/2011 1753   MONOABS 1.2 (H) 05/21/2011 1753   EOSABS 0.0 05/21/2011 1753   BASOSABS 0.0 05/21/2011 1753   lithium level October 21, 2017 0.4.  This was on 675 mg a day same his current dosage.  His level in November 18 was 1.10 900 mg a day.  The dosage was reduced because of elevated creatinine which in June was 1.53. Lithium Lvl  Date Value Ref Range Status  05/29/2020 1.0 0.6 - 1.2 mmol/L Final   This was on 900 mg lithium daily.  No results found for: PHENYTOIN, PHENOBARB, VALPROATE, CBMZ   Adult self-report scale for ADD score was 29 on inattentive side which is high and 23 on the hyperactive scale which is borderline high suggestive of ADHD  .res Assessment: Plan:    Nickoli was seen today for follow-up and bipolar i disorder ([facility]).  Diagnoses and all orders for this visit:  Bipolar I disorder, most recent episode (or  current) manic ([place]) -     Basic metabolic panel -     Lithium level  Attention deficit hyperactivity disorder (ADHD), combined type  Lithium use -     Basic metabolic panel -     Lithium level  Bipolar I disorder ([facility]) -     QUEtiapine (SEROQUEL) 50 MG tablet; Take 3 tablets (150 mg total) by mouth at bedtime.  Bipolar disorder under good control with lithium.     Hx borderline creatinine. Counseled patient regarding potential benefits, risks, and side effects of lithium to include potential risk of lithium affecting thyroid and renal function.  Discussed need for periodic lab monitoring to determine drug level and to assess for potential adverse effects.  Counseled patient regarding signs and symptoms of lithium toxicity and advised that they notify office immediately or seek urgent medical attention if experiencing these signs and symptoms.  Patient advised to contact office with any questions or concerns.  05/29/2020 lithium level stable 1.0. BMP better CR. Check labs again bc is due.  Needed increase Seroquel to 300 mg HS for recent mania not prevented by lithium .  But he says it's too sedating so dropped to 150.  . not depressed after increase  lithium to 900 mg daily.   Take highest tolerated dose of Seroquel to reduce manic risk.  Discussed his high score on the ADHD self questionnaire.  He has a daughter with ADD.  He feels this is interfering with his job function and quality of life and would like to try treatment.  We discussed stimulant and non-stimulant options for treatment of ADHD.  Discussed the risk that some of these treatments could trigger mania and a bipolar patient.  Discussed side effects in detail.  Discussed the differences between types of stimulants including methylphenidate and amphetamine based.  Also discussed the differences between short acting and long-acting stimulants and generally long-acting are preferred in adults.  Discussed this again at this visit because  his daughter takes Adderall and he wanted to know the difference.  Discussed potential addiction risk.  Discussed potential benefits, risks, and side effects of stimulants with patient to include increased heart rate, palpitations, insomnia, increased anxiety, increased irritability, or decreased appetite.  Instructed patient to contact office if experiencing any significant tolerability issues.  Vyvanse 50 mg daily. Good responsel  He is only taking it on workdays and on heavy work days occasionally doubles up.  Discussed the risk with this because that is higher than the usual recommended dose.  However he is not taking it at all on weekends or days that he is not working.  Productivity better and better on task with Vyvanse  PDMP is clear  Disc potential untested as yet value of hallucinogenics on treatment of PTSD in the future and pending studies.  Risk not quantified yet.  This appt was 30 mins.  FU 3  mos   Lynder Parents, MD, DFAPA   Please see After Visit Summary for patient specific instructions.  No future appointments.   Orders Placed This Encounter  Procedures   Basic metabolic panel   Lithium level      -------------------------------

## 2020-11-06 ENCOUNTER — Other Ambulatory Visit: Payer: Self-pay | Admitting: Dermatology

## 2020-11-06 ENCOUNTER — Other Ambulatory Visit (HOSPITAL_COMMUNITY): Payer: Self-pay

## 2020-11-06 ENCOUNTER — Other Ambulatory Visit: Payer: Self-pay | Admitting: Internal Medicine

## 2020-11-06 NOTE — Telephone Encounter (Signed)
Requested medication (s) are due for refill today: yes  Requested medication (s) are on the active medication list: yes  Last refill:  10/18/2020  Future visit scheduled:no  Notes to clinic:  this refill cannot be delegated    Requested Prescriptions  Pending Prescriptions Disp Refills   Adalimumab (HUMIRA PEN) 40 MG/0.8ML PNKT 2 each 3    Sig: INJECT 40 MG INTO THE SKIN EVERY 14 DAYS.      Not Delegated - Immunology: Tumor Necrosis Factor Blockers - adalimumab & golimumab Failed - 11/06/2020 10:10 AM      Failed - This refill cannot be delegated      Failed - ALT in normal range and within 180 days    ALT  Date Value Ref Range Status  05/21/2011 37 0 - 53 U/L Final          Failed - AST in normal range and within 180 days    AST  Date Value Ref Range Status  05/21/2011 48 (H) 0 - 37 U/L Final          Failed - HCT in normal range and within 180 days    HCT  Date Value Ref Range Status  05/21/2011 46.1 39.0 - 52.0 % Final          Failed - HGB in normal range and within 180 days    Hemoglobin  Date Value Ref Range Status  05/21/2011 16.7 13.0 - 17.0 g/dL Final          Failed - PLT in normal range and within 180 days    Platelets  Date Value Ref Range Status  05/21/2011 216 150 - 400 K/uL Final          Failed - WBC in normal range and within 180 days    WBC  Date Value Ref Range Status  05/21/2011 19.2 (H) 4.0 - 10.5 K/uL Final          Passed - Valid encounter within last 6 months    Recent Outpatient Visits           3 months ago Encounter for medication review   [place], [facility]   1 year ago Encounter for medication review   [place], [facility]

## 2020-11-07 ENCOUNTER — Other Ambulatory Visit (HOSPITAL_COMMUNITY): Payer: Self-pay

## 2020-11-12 ENCOUNTER — Other Ambulatory Visit (HOSPITAL_COMMUNITY): Payer: Self-pay

## 2020-11-15 ENCOUNTER — Other Ambulatory Visit (HOSPITAL_COMMUNITY): Payer: Self-pay

## 2020-11-19 DIAGNOSIS — F3110 Bipolar disorder, current episode manic without psychotic features, unspecified: Secondary | ICD-10-CM | POA: Diagnosis not present

## 2020-11-19 DIAGNOSIS — Z79899 Other long term (current) drug therapy: Secondary | ICD-10-CM | POA: Diagnosis not present

## 2020-11-20 LAB — BASIC METABOLIC PANEL
BUN/Creatinine Ratio: 9 (calc) (ref 6–22)
BUN: 12 mg/dL (ref 7–25)
CO2: 27 mmol/L (ref 20–32)
Calcium: 9.4 mg/dL (ref 8.6–10.3)
Chloride: 104 mmol/L (ref 98–110)
Creat: 1.4 mg/dL — ABNORMAL HIGH (ref 0.70–1.30)
Glucose, Bld: 88 mg/dL (ref 65–99)
Potassium: 4.2 mmol/L (ref 3.5–5.3)
Sodium: 137 mmol/L (ref 135–146)

## 2020-11-20 LAB — LITHIUM LEVEL: Lithium Lvl: 1 mmol/L (ref 0.6–1.2)

## 2020-11-25 NOTE — Progress Notes (Signed)
Stable lithium and CR

## 2020-12-04 ENCOUNTER — Other Ambulatory Visit (HOSPITAL_COMMUNITY): Payer: Self-pay

## 2020-12-04 ENCOUNTER — Other Ambulatory Visit: Payer: Self-pay | Admitting: Urology

## 2020-12-05 ENCOUNTER — Other Ambulatory Visit (HOSPITAL_COMMUNITY): Payer: Self-pay

## 2020-12-05 MED ORDER — ALFUZOSIN HCL ER 10 MG PO TB24
10.0000 mg | ORAL_TABLET | Freq: Two times a day (BID) | ORAL | 0 refills | Status: DC
  Filled 2020-12-05: qty 180, 90d supply, fill #0

## 2020-12-09 ENCOUNTER — Other Ambulatory Visit (HOSPITAL_COMMUNITY): Payer: Self-pay

## 2020-12-17 ENCOUNTER — Other Ambulatory Visit (HOSPITAL_COMMUNITY): Payer: Self-pay

## 2020-12-18 ENCOUNTER — Other Ambulatory Visit (HOSPITAL_COMMUNITY): Payer: Self-pay

## 2020-12-18 ENCOUNTER — Other Ambulatory Visit: Payer: Self-pay | Admitting: Psychiatry

## 2020-12-18 DIAGNOSIS — F902 Attention-deficit hyperactivity disorder, combined type: Secondary | ICD-10-CM

## 2020-12-18 MED ORDER — LISDEXAMFETAMINE DIMESYLATE 50 MG PO CAPS
50.0000 mg | ORAL_CAPSULE | Freq: Every day | ORAL | 0 refills | Status: DC
  Filled 2020-12-18: qty 90, 90d supply, fill #0

## 2020-12-26 DIAGNOSIS — R0981 Nasal congestion: Secondary | ICD-10-CM | POA: Diagnosis not present

## 2020-12-31 ENCOUNTER — Other Ambulatory Visit (HOSPITAL_COMMUNITY): Payer: Self-pay

## 2020-12-31 ENCOUNTER — Other Ambulatory Visit: Payer: Self-pay | Admitting: Dermatology

## 2021-01-13 ENCOUNTER — Other Ambulatory Visit (HOSPITAL_COMMUNITY): Payer: Self-pay

## 2021-01-13 ENCOUNTER — Other Ambulatory Visit: Payer: Self-pay | Admitting: Psychiatry

## 2021-01-13 DIAGNOSIS — F319 Bipolar disorder, unspecified: Secondary | ICD-10-CM

## 2021-01-13 MED ORDER — LITHIUM CARBONATE ER 450 MG PO TBCR
EXTENDED_RELEASE_TABLET | Freq: Two times a day (BID) | ORAL | 1 refills | Status: DC
  Filled 2021-01-13: qty 180, 90d supply, fill #0
  Filled 2021-04-10: qty 180, 90d supply, fill #1

## 2021-01-14 ENCOUNTER — Other Ambulatory Visit (HOSPITAL_COMMUNITY): Payer: Self-pay

## 2021-01-15 ENCOUNTER — Other Ambulatory Visit (HOSPITAL_COMMUNITY): Payer: Self-pay

## 2021-01-20 DIAGNOSIS — J342 Deviated nasal septum: Secondary | ICD-10-CM | POA: Diagnosis not present

## 2021-01-20 DIAGNOSIS — J343 Hypertrophy of nasal turbinates: Secondary | ICD-10-CM | POA: Diagnosis not present

## 2021-02-04 ENCOUNTER — Other Ambulatory Visit (HOSPITAL_COMMUNITY): Payer: Self-pay

## 2021-02-04 MED FILL — Atorvastatin Calcium Tab 20 MG (Base Equivalent): ORAL | 90 days supply | Qty: 90 | Fill #2 | Status: AC

## 2021-02-19 ENCOUNTER — Other Ambulatory Visit: Payer: Self-pay | Admitting: Dermatology

## 2021-02-19 ENCOUNTER — Other Ambulatory Visit (HOSPITAL_COMMUNITY): Payer: Self-pay

## 2021-02-21 ENCOUNTER — Other Ambulatory Visit (HOSPITAL_COMMUNITY): Payer: Self-pay

## 2021-02-27 ENCOUNTER — Other Ambulatory Visit: Payer: Self-pay | Admitting: Dermatology

## 2021-02-27 ENCOUNTER — Other Ambulatory Visit (HOSPITAL_COMMUNITY): Payer: Self-pay

## 2021-03-06 ENCOUNTER — Other Ambulatory Visit: Payer: Self-pay | Admitting: Urology

## 2021-03-06 ENCOUNTER — Other Ambulatory Visit (HOSPITAL_COMMUNITY): Payer: Self-pay

## 2021-03-06 ENCOUNTER — Other Ambulatory Visit: Payer: Self-pay | Admitting: Dermatology

## 2021-03-06 MED ORDER — ALFUZOSIN HCL ER 10 MG PO TB24
10.0000 mg | ORAL_TABLET | Freq: Two times a day (BID) | ORAL | 0 refills | Status: DC
  Filled 2021-03-06: qty 180, 90d supply, fill #0

## 2021-03-06 NOTE — Telephone Encounter (Signed)
DUE TB TEST

## 2021-03-08 ENCOUNTER — Other Ambulatory Visit (HOSPITAL_COMMUNITY): Payer: Self-pay

## 2021-03-10 ENCOUNTER — Other Ambulatory Visit (HOSPITAL_COMMUNITY): Payer: Self-pay

## 2021-03-13 ENCOUNTER — Other Ambulatory Visit (HOSPITAL_COMMUNITY): Payer: Self-pay

## 2021-03-13 ENCOUNTER — Telehealth: Payer: Self-pay | Admitting: Dermatology

## 2021-03-13 ENCOUNTER — Other Ambulatory Visit: Payer: Self-pay | Admitting: Psychiatry

## 2021-03-13 DIAGNOSIS — F902 Attention-deficit hyperactivity disorder, combined type: Secondary | ICD-10-CM

## 2021-03-13 NOTE — Telephone Encounter (Signed)
Patient left message on office voice mail that he needed to get a refill on Humira.  Message left on patient's cell phone voice mail that since his last office visit was May 2021 that patient would need an office visit.

## 2021-03-14 ENCOUNTER — Other Ambulatory Visit (HOSPITAL_COMMUNITY): Payer: Self-pay

## 2021-03-14 MED ORDER — LISDEXAMFETAMINE DIMESYLATE 50 MG PO CAPS
50.0000 mg | ORAL_CAPSULE | Freq: Every day | ORAL | 0 refills | Status: DC
  Filled 2021-03-17: qty 90, 90d supply, fill #0

## 2021-03-14 NOTE — Telephone Encounter (Signed)
Please send his 90 day

## 2021-03-17 ENCOUNTER — Other Ambulatory Visit (HOSPITAL_COMMUNITY): Payer: Self-pay

## 2021-03-26 ENCOUNTER — Other Ambulatory Visit: Payer: Self-pay

## 2021-03-26 ENCOUNTER — Encounter: Payer: Self-pay | Admitting: Dermatology

## 2021-03-26 ENCOUNTER — Ambulatory Visit: Payer: 59 | Admitting: Dermatology

## 2021-03-26 DIAGNOSIS — Z1283 Encounter for screening for malignant neoplasm of skin: Secondary | ICD-10-CM

## 2021-03-26 DIAGNOSIS — D1801 Hemangioma of skin and subcutaneous tissue: Secondary | ICD-10-CM

## 2021-03-26 DIAGNOSIS — Z79899 Other long term (current) drug therapy: Secondary | ICD-10-CM | POA: Diagnosis not present

## 2021-03-26 DIAGNOSIS — L400 Psoriasis vulgaris: Secondary | ICD-10-CM

## 2021-03-27 DIAGNOSIS — Z79899 Other long term (current) drug therapy: Secondary | ICD-10-CM | POA: Diagnosis not present

## 2021-03-27 DIAGNOSIS — L400 Psoriasis vulgaris: Secondary | ICD-10-CM | POA: Diagnosis not present

## 2021-03-30 LAB — QUANTIFERON-TB GOLD PLUS
Mitogen-NIL: >10 IU/mL
NIL: 0.02 IU/mL
QuantiFERON-TB Gold Plus: NEGATIVE
TB1-NIL: 0.01 IU/mL
TB2-NIL: 0.01 IU/mL

## 2021-04-03 ENCOUNTER — Other Ambulatory Visit (HOSPITAL_COMMUNITY): Payer: Self-pay

## 2021-04-03 ENCOUNTER — Telehealth: Payer: Self-pay | Admitting: Dermatology

## 2021-04-03 DIAGNOSIS — L400 Psoriasis vulgaris: Secondary | ICD-10-CM

## 2021-04-03 DIAGNOSIS — L409 Psoriasis, unspecified: Secondary | ICD-10-CM

## 2021-04-03 MED ORDER — ADALIMUMAB 40 MG/0.8ML ~~LOC~~ AJKT
AUTO-INJECTOR | SUBCUTANEOUS | 8 refills | Status: DC
  Filled 2021-04-03: qty 2, 28d supply, fill #0

## 2021-04-03 NOTE — Telephone Encounter (Signed)
Fax received from [place] for PA request for Humira

## 2021-04-03 NOTE — Telephone Encounter (Signed)
PA done through Cover My Meds for the patients Humira.    [employer] is reviewing your PA request. You may close this dialog, return to your dashboard, and perform other tasks.  To check for an update later, open this request again from your dashboard. If [employer] has not replied within 24 hours for urgent requests or within 48 hours for standard requests, please contact [employer] at [phone].

## 2021-04-03 NOTE — Telephone Encounter (Signed)
Patient left message on office voice mail that he has gotten his TB test and labs done and wants to make sure that his Humira prescription is being processed because his psoriasis is beginning to flare.

## 2021-04-04 ENCOUNTER — Other Ambulatory Visit (HOSPITAL_COMMUNITY): Payer: Self-pay

## 2021-04-07 ENCOUNTER — Telehealth: Payer: Self-pay | Admitting: *Deleted

## 2021-04-07 ENCOUNTER — Other Ambulatory Visit (HOSPITAL_COMMUNITY): Payer: Self-pay

## 2021-04-07 ENCOUNTER — Other Ambulatory Visit: Payer: Self-pay | Admitting: Pharmacist

## 2021-04-07 DIAGNOSIS — L409 Psoriasis, unspecified: Secondary | ICD-10-CM

## 2021-04-07 DIAGNOSIS — L400 Psoriasis vulgaris: Secondary | ICD-10-CM

## 2021-04-07 MED ORDER — ADALIMUMAB 40 MG/0.8ML ~~LOC~~ AJKT
AUTO-INJECTOR | SUBCUTANEOUS | 8 refills | Status: DC
  Filled 2021-04-07: qty 2, 28d supply, fill #0
  Filled 2021-04-30 – 2021-05-08 (×2): qty 2, 28d supply, fill #1
  Filled 2021-06-04: qty 2, 28d supply, fill #2
  Filled 2021-09-01: qty 2, 28d supply, fill #3
  Filled 2021-09-30: qty 2, 28d supply, fill #4
  Filled 2021-10-23: qty 2, 28d supply, fill #5
  Filled 2021-12-18: qty 2, 28d supply, fill #6
  Filled 2022-01-21: qty 2, 28d supply, fill #7

## 2021-04-07 NOTE — Telephone Encounter (Signed)
Prior authorization done for Humira approved 04/05/2021-10/03/2021.  Prior Authorization reference number :[id number] Plan code: [email or web address]

## 2021-04-10 ENCOUNTER — Other Ambulatory Visit (HOSPITAL_COMMUNITY): Payer: Self-pay

## 2021-04-11 ENCOUNTER — Other Ambulatory Visit (HOSPITAL_COMMUNITY): Payer: Self-pay

## 2021-04-13 ENCOUNTER — Encounter: Payer: Self-pay | Admitting: Dermatology

## 2021-04-13 NOTE — Progress Notes (Signed)
° °  Follow-Up Visit   Subjective  Corey Reilly is a 60 y.o. male who presents for the following: Psoriasis (Needs refill on humira- keeps clear).  Psoriasis follow-up, general skin check Location:  Duration:  Quality:  Associated Signs/Symptoms: Modifying Factors:  Severity:  Timing: Context:   Objective  Well appearing patient in no apparent distress; mood and affect are within normal limits. Chest - Medial [facility]), Left Lower Leg - Posterior, Scalp Clear today.  No synovitis.  No side effects with Humira.  Trim and active.  Right Breast Dark vascular lesion, typical dermoscopy.  Waist Up Waist up exam today no signs of atypical moles, melanoma or non mole skin cancer.     A full examination was performed including scalp, head, eyes, ears, nose, lips, neck, chest, axillae, abdomen, back, buttocks, bilateral upper extremities, bilateral lower extremities, hands, feet, fingers, toes, fingernails, and toenails. All findings within normal limits unless otherwise noted below.   Assessment & Plan    Psoriasis vulgaris Left Lower Leg - Posterior; Chest - Medial [facility]); Scalp  Continue Humira treatment. Yearly skin check.  Update TB testing.  QuantiFERON-TB Gold Plus - Chest - Medial [facility]), Left Lower Leg - Posterior, Scalp  Encounter for long-term (current) use of high-risk medication  Related Procedures QuantiFERON-TB Gold Plus  Cherry angioma Right Breast  No intervention needed  Skin exam for malignant neoplasm Waist Up  Yearly skin check      I, Lavonna Monarch, MD, have reviewed all documentation for this visit.  The documentation on 04/13/21 for the exam, diagnosis, procedures, and orders are all accurate and complete.

## 2021-04-14 DIAGNOSIS — N401 Enlarged prostate with lower urinary tract symptoms: Secondary | ICD-10-CM | POA: Diagnosis not present

## 2021-04-14 DIAGNOSIS — Z125 Encounter for screening for malignant neoplasm of prostate: Secondary | ICD-10-CM | POA: Diagnosis not present

## 2021-04-14 DIAGNOSIS — Z131 Encounter for screening for diabetes mellitus: Secondary | ICD-10-CM | POA: Diagnosis not present

## 2021-04-14 DIAGNOSIS — F3160 Bipolar disorder, current episode mixed, unspecified: Secondary | ICD-10-CM | POA: Diagnosis not present

## 2021-04-14 DIAGNOSIS — Z0000 Encounter for general adult medical examination without abnormal findings: Secondary | ICD-10-CM | POA: Diagnosis not present

## 2021-04-14 DIAGNOSIS — E7800 Pure hypercholesterolemia, unspecified: Secondary | ICD-10-CM | POA: Diagnosis not present

## 2021-04-14 DIAGNOSIS — E039 Hypothyroidism, unspecified: Secondary | ICD-10-CM | POA: Diagnosis not present

## 2021-04-14 DIAGNOSIS — Z23 Encounter for immunization: Secondary | ICD-10-CM | POA: Diagnosis not present

## 2021-04-24 ENCOUNTER — Other Ambulatory Visit (HOSPITAL_COMMUNITY): Payer: Self-pay

## 2021-04-30 ENCOUNTER — Other Ambulatory Visit (HOSPITAL_COMMUNITY): Payer: Self-pay

## 2021-05-05 DIAGNOSIS — Z23 Encounter for immunization: Secondary | ICD-10-CM | POA: Diagnosis not present

## 2021-05-08 ENCOUNTER — Other Ambulatory Visit: Payer: Self-pay

## 2021-05-08 ENCOUNTER — Encounter: Payer: Self-pay | Admitting: Psychiatry

## 2021-05-08 ENCOUNTER — Other Ambulatory Visit (HOSPITAL_COMMUNITY): Payer: Self-pay

## 2021-05-08 ENCOUNTER — Ambulatory Visit: Payer: 59 | Admitting: Psychiatry

## 2021-05-08 VITALS — BP 152/71 | HR 73

## 2021-05-08 DIAGNOSIS — F319 Bipolar disorder, unspecified: Secondary | ICD-10-CM

## 2021-05-08 DIAGNOSIS — F902 Attention-deficit hyperactivity disorder, combined type: Secondary | ICD-10-CM | POA: Diagnosis not present

## 2021-05-08 DIAGNOSIS — Z79899 Other long term (current) drug therapy: Secondary | ICD-10-CM

## 2021-05-08 DIAGNOSIS — F3110 Bipolar disorder, current episode manic without psychotic features, unspecified: Secondary | ICD-10-CM | POA: Diagnosis not present

## 2021-05-08 MED ORDER — LISDEXAMFETAMINE DIMESYLATE 50 MG PO CAPS
50.0000 mg | ORAL_CAPSULE | Freq: Every day | ORAL | 0 refills | Status: DC
  Filled 2021-05-08 – 2021-06-23 (×2): qty 90, 90d supply, fill #0

## 2021-05-08 MED ORDER — LITHIUM CARBONATE ER 450 MG PO TBCR
450.0000 mg | EXTENDED_RELEASE_TABLET | Freq: Two times a day (BID) | ORAL | 1 refills | Status: DC
  Filled 2021-05-08: qty 180, fill #0

## 2021-05-08 NOTE — Progress Notes (Addendum)
JAMAHL Reilly [id number] 03/30/1961 61 y.o.  Subjective:   Patient ID:  Corey Reilly is a 60 y.o. (DOB December 06, 1960) male.  Chief Complaint:  Chief Complaint  Patient presents with   Follow-up    Bipolar I disorder, most recent episode (or current) manic ([place])   ADHD    Depression        Associated symptoms include decreased concentration.  Associated symptoms include no suicidal ideas. Corey Reilly presents to the office today for follow-up of bipolar disorder and concerns about focus and possible ADD.  When seen December 2019.  No meds were changed.  Labs were received with low normal lithium level.  Creatinine was slightly elevated at 1.4.  When seen in June.  No meds were changed.  Was recommended that he try NAC 600 mg daily for cognition.  Completed ADHD self questionnaire which was high on both the inattention and hyperactivity scales.  Tried NAC 1000 mg with less memory issues but not dramatic.  Does think it's improved but not enough and wanted to pursue a trial of stimulants.  This was discussed with his wife who noted the following: Periods of poor function intermittently.  Gets distracted and then forgets.  Cannot control it.  Wonders if it's ADD.  D dx ADHD and the 2 of them are similar and she is on Adderall.  At [employer].  Constantly distracted.  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  Therefore at the visit in August we initiated a trial of Vyvanse 30 mg daily. He called back September 3 asking for refills stating it was working well.  He did increase Vyvanse to 40 mg daily.  seen February 06, 2019.  Rec trial NAC 1000 mg for STM and continue Vyvanse 30 as best tolerated dose.  No other med changes.  Overall doing OK.  Expected stress with Covid and politics.  Mental health is OK. Doesn't feel the Vyvanse in any kick or mood but focus is definitely better.  Last month is best month ever and much mor productive and efficient.  Lost a little weight on it.   SE low appetite so skips on the weekend.  Less distractible with Vyvanse and only uses work days.    seen May 09, 2019.  No meds were changed.  08/07/19 appt with the following noted: He increased the lithium dose back to 900 mg daily about first of April, because he was having some manic and depressive symptoms.  Lithium level requested was 0.6 at lower dosage of 675 mg daily.. Mood the last few days is OK.  Dealing with some pain in TMJ that is affecting him. 2 nights of no sleep but otherwise normal sleep. Has used more caffeine than usual but only in the morning.    Patient reports stable mood and denies depressed or irritable moods.  Patient denies any recent difficulty with anxiety.  Patient denies difficulty with sleep initiation or maintenance. Denies appetite disturbance.  Patient reports that energy and motivation have been good.  Patient denies any difficulty with concentration.  Patient denies any suicidal ideation. Plan: He is not manic and no longer depressed after increase lithium to 900 mg daily.  stable even with Vyvanse and doing well with lithium  12/06/19 appt with the following noted: All in all well.  Not sign manic.  Stayed on lithium 900.  Sleep good on 50 mg daily 7-8 hours.  No SE except  Not hungry on Vyvanse.  05/08/2020 appt noted: Mood  OK overall.  But got worked up too much last week for about 3 hours but it only lasted those hours. Some problems with sleep without apparent reason though occ forgets it. Sometimes a night with no sleep 1-2 times a month.  Usually good sleep routine. Melatonin and benadryl  Health is good. Plan: Get labs as soon as possible  06/14/2020 phone call with patient:  Patient called because of emerging mania. MD response: Return telephone call to patient: Had information indirectly from his wife that the patient is manic. Patient reports only 1 hour of sleep last night, impulsive somewhat reckless decision making with poor  concentration.  He feels revved up even and is up in his body at night.  He has been compliant with medications. No psychotic symptoms and no suicidal thoughts or homicidal thoughts. Discussed that the patient is having manic symptoms that are breaking through despite adequate lithium level of 1.0 just a few days ago.  Therefore going up and lithium is not a good option.  He is taking quetiapine 50 mg nightly.  Discussed the pros and cons of increasing quetiapine to manage this acute mania versus adding Depakote which is the other more common mood stabilizer.  It is likely that increasing quetiapine will work quicker because he will start sleeping better almost right away probably.  Discussed the dosing range of Seroquel for mania is 300 to 800 mg nightly.  Discussed the side effects of Seroquel. Increase Seroquel to 300 mg nightly and continue lithium. Schedule follow-up soon We will discussed the option of transitioning him to Depakote as an alternative to the lithium at his appointment.  07/11/2020 appointment with the following noted:  Did the job with mood stab ilizing.  But was pretty hungover and reduced the dose to 150 mg HS and it seems fine.  Denies any manic sx and doesn't think wife sees him manic now.  Sleep is fine about 7-8 hours.   SE a bit harder getting up and going int he morning.  No clear triggers. Wife Corey Reilly thought his manic sx lasted about 4 weeks with volatility and difficulty functioning with work.  Hyperverbal and overinclusive and hypergraphia.  Noticed by others but resolved.  Couldn't stop himself.  Didn't really recognize mania until pointed out to him. Plan: OK increase Vyvanse 50 mg daily.   11/05/2020 appointment with the following noted: Doing well overall home and work and life fine.  Vyvanse helpful.   Continue meds.  Tried microdosing mushrooms over 6 weeks and it seemed to help.  Helped him be a better person.   Plan: Needed increase Seroquel to 300 mg HS for  recent mania not prevented by lithium .  But he says it's too sedating so dropped to 150.  . not depressed after increase lithium to 900 mg daily.   Continue Vyvanse 50 mg every morning  05/08/2021 appointment with the following noted: Never better as an adult. Taking low dose Seroquel 75 mg HS.  Sleeping well for the most part. No SE with meds. Satisfied with meds.  Benefit. Health has been good. Patient reports stable mood and denies depressed or irritable moods.  Patient denies any recent difficulty with anxiety.  Patient denies difficulty with sleep initiation or maintenance. Denies appetite disturbance.  Patient reports that energy and motivation have been good.  Patient denies any difficulty with concentration.  Patient denies any suicidal ideation.  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 D  doesn't drink bc pt has hx of alcohol dependence.  He's been sober for many years.  Attention problems.  Talked to wife.    Past psychiatric medication trials include Geodon which caused sedation, Zyprexa which caused sedation, Latuda which caused akathisia and Seroquel Vyvanse Found out Aunt has bipolar disabled.  Review of Systems:  Review of Systems  Respiratory:  Negative for shortness of breath.   Cardiovascular:  Negative for chest pain and palpitations.  Skin:  Positive for rash.  Neurological:  Negative for dizziness, tremors, weakness and light-headedness.  Psychiatric/Behavioral:  Positive for decreased concentration and depression. Negative for agitation, behavioral problems, confusion, dysphoric mood, hallucinations, self-injury, sleep disturbance and suicidal ideas. The patient is not nervous/anxious and is not hyperactive.    Medications: I have reviewed the patient's current medications.  Current Outpatient Medications  Medication Sig Dispense Refill   Adalimumab 40 MG/0.8ML PNKT INJECT 40 MG INTO THE SKIN EVERY 14 DAYS. 2 each 8   alfuzosin  (UROXATRAL) 10 MG 24 hr tablet Take 1 tablet (10 mg total) by mouth 2 (two) times daily. 180 tablet 0   atorvastatin (LIPITOR) 20 MG tablet Take 10 mg by mouth daily.  11   QUEtiapine (SEROQUEL) 50 MG tablet Take 3 tablets (150 mg total) by mouth at bedtime. (Patient taking differently: Take 150 mg by mouth at bedtime. 75 mg daily) 270 tablet 1   atorvastatin (LIPITOR) 20 MG tablet TAKE 1 TABLET BY MOUTH ONCE A DAY 90 tablet 3   lisdexamfetamine (VYVANSE) 50 MG capsule Take 1 capsule (50 mg total) by mouth every morning 90 capsule 0   lithium carbonate (ESKALITH) 450 MG CR tablet TAKE 1 TABLET BY MOUTH 2 TIMES DAILY 180 tablet 1   No current facility-administered medications for this visit.    Medication Side Effects: None  Allergies: No Known Allergies  Past Medical History:  Diagnosis Date   Bipolar disorder ([place])    Depression    Psoriasis    Ulcer     Family History  Problem Relation Age of Onset   Hyperlipidemia Father    COPD Maternal Uncle     Social History   Socioeconomic History   Marital status: Married    Spouse name: Not on file   Number of children: Not on file   Years of education: Not on file   Highest education level: Not on file  Occupational History   Not on file  Tobacco Use   Smoking status: Never   Smokeless tobacco: Never  Substance and Sexual Activity   Alcohol use: Not on file   Drug use: Not on file   Sexual activity: Not on file  Other Topics Concern   Not on file  Social History Narrative   Not on file   Social Determinants of Health   Financial Resource Strain: Not on file  Food Insecurity: Not on file  Transportation Needs: Not on file  Physical Activity: Not on file  Stress: Not on file  Social Connections: Not on file  Intimate Partner Violence: Not on file    Past Medical History, Surgical history, Social history, and Family history were reviewed and updated as appropriate.   Please see review of systems for further  details on the patient's review from today.   Objective:   Physical Exam:  BP (!) 152/71    Pulse 73   Physical Exam Constitutional:      General: He is not in acute distress. Musculoskeletal:  General: No deformity.  Neurological:     Mental Status: He is alert and oriented to person, place, and time.     Cranial Nerves: No dysarthria.     Coordination: Coordination normal.  Psychiatric:        Attention and Perception: Attention and perception normal. He does not perceive auditory or visual hallucinations.        Mood and Affect: Mood normal. Mood is not anxious, depressed or elated. Affect is not labile, blunt or angry.        Speech: Speech normal. Speech is not rapid and pressured or slurred.        Behavior: Behavior normal. Behavior is not agitated. Behavior is cooperative.        Thought Content: Thought content normal. Thought content is not delusional. Thought content does not include homicidal or suicidal ideation. Thought content does not include suicidal plan.        Cognition and Memory: Cognition and memory normal.        Judgment: Judgment normal.     Comments: Insight intact    Lab Review:     Component Value Date/Time   NA 137 11/19/2020 1503   K 4.2 11/19/2020 1503   CL 104 11/19/2020 1503   CO2 27 11/19/2020 1503   GLUCOSE 88 11/19/2020 1503   BUN 12 11/19/2020 1503   CREATININE 1.40 (H) 11/19/2020 1503   CALCIUM 9.4 11/19/2020 1503   PROT 8.3 05/21/2011 1753   ALBUMIN 4.4 05/21/2011 1753   AST 48 (H) 05/21/2011 1753   ALT 37 05/21/2011 1753   ALKPHOS 87 05/21/2011 1753   BILITOT 2.5 (H) 05/21/2011 1753   GFRNONAA 79 (L) 05/21/2011 1753   GFRAA >90 05/21/2011 1753       Component Value Date/Time   WBC 19.2 (H) 05/21/2011 1753   RBC 4.91 05/21/2011 1753   HGB 16.7 05/21/2011 1753   HCT 46.1 05/21/2011 1753   PLT 216 05/21/2011 1753   MCV 93.9 05/21/2011 1753   MCH 34.0 05/21/2011 1753   MCHC 36.2 (H) 05/21/2011 1753   RDW 13.0  05/21/2011 1753   LYMPHSABS 0.8 05/21/2011 1753   MONOABS 1.2 (H) 05/21/2011 1753   EOSABS 0.0 05/21/2011 1753   BASOSABS 0.0 05/21/2011 1753   lithium level October 21, 2017 0.4.  This was on 675 mg a day same his current dosage.  His level in November 18 was 1.10 900 mg a day.  The dosage was reduced because of elevated creatinine which in June was 1.53. Lithium Lvl  Date Value Ref Range Status  11/19/2020 1.0 0.6 - 1.2 mmol/L Final   This was on 900 mg lithium daily.  No results found for: PHENYTOIN, PHENOBARB, VALPROATE, CBMZ   Adult self-report scale for ADD score was 29 on inattentive side which is high and 23 on the hyperactive scale which is borderline high suggestive of ADHD  .res Assessment: Plan:    Greyson was seen today for follow-up and adhd.  Diagnoses and all orders for this visit:  Bipolar I disorder, most recent episode (or current) manic ([place]) -     Lithium level -     Basic metabolic panel -     TSH  Attention deficit hyperactivity disorder (ADHD), combined type -     lisdexamfetamine (VYVANSE) 50 MG capsule; Take 1 capsule (50 mg total) by mouth every morning  Lithium use -     Lithium level -     Basic metabolic panel -  TSH  Bipolar I disorder ([facility]) -     lithium carbonate (ESKALITH) 450 MG CR tablet; TAKE 1 TABLET BY MOUTH 2 TIMES DAILY   Bipolar disorder under good control with lithium.     Hx borderline creatinine. Counseled patient regarding potential benefits, risks, and side effects of lithium to include potential risk of lithium affecting thyroid and renal function.  Discussed need for periodic lab monitoring to determine drug level and to assess for potential adverse effects.  Counseled patient regarding signs and symptoms of lithium toxicity and advised that they notify office immediately or seek urgent medical attention if experiencing these signs and symptoms.  Patient advised to contact office with any questions or concerns.   Check labs  again bc is due.  Needed increase Seroquel to 300 mg HS for recent mania not prevented by lithium in the past but has been stable on just 75 mg nightly as needed for sleep over the last several months..  He was too sedated on Seroquel 300 mg nightly. For convenience we will reduce Seroquel to 50 mg nightly.  And also for tolerability. . not depressed after increase lithium to 900 mg daily.   Overall reports that during this last year his mood has been the best it has ever been.  Vyvanse 50 mg daily. Good responsel  He is only taking it on workdays and on heavy work days occasionally doubles up.  Discussed the risk with this because that is higher than the usual recommended dose.  However he is not taking it at all on weekends or days that he is not working.  Productivity better and better on task with Vyvanse  PDMP is clear  Call if any recurrence of mania.  He feels capable of recognizing the symptoms.  FU  6 mos   Lynder Parents, MD, DFAPA   Please see After Visit Summary for patient specific instructions.  Future Appointments  Date Time Provider [place]  11/06/2021 11:00 AM Cottle, Billey Co., MD [facility] None  03/30/2022  1:45 PM Lavonna Monarch, MD [facility]     Orders Placed This Encounter  Procedures   Lithium level   Basic metabolic panel   TSH      -------------------------------

## 2021-05-09 ENCOUNTER — Other Ambulatory Visit (HOSPITAL_COMMUNITY): Payer: Self-pay

## 2021-05-09 MED ORDER — ATORVASTATIN CALCIUM 20 MG PO TABS
20.0000 mg | ORAL_TABLET | Freq: Every day | ORAL | 3 refills | Status: DC
  Filled 2021-05-09 – 2021-05-20 (×2): qty 90, 90d supply, fill #0
  Filled 2021-08-20: qty 90, 90d supply, fill #1
  Filled 2021-12-01: qty 90, 90d supply, fill #2
  Filled 2022-03-02: qty 90, 90d supply, fill #3

## 2021-05-12 ENCOUNTER — Telehealth: Payer: Self-pay | Admitting: Psychiatry

## 2021-05-12 ENCOUNTER — Other Ambulatory Visit: Payer: Self-pay

## 2021-05-12 DIAGNOSIS — F319 Bipolar disorder, unspecified: Secondary | ICD-10-CM

## 2021-05-12 MED ORDER — LITHIUM CARBONATE ER 450 MG PO TBCR: 450.0000 mg | EXTENDED_RELEASE_TABLET | Freq: Two times a day (BID) | ORAL | 0 refills | Status: DC

## 2021-05-12 MED ORDER — QUETIAPINE FUMARATE 50 MG PO TABS: 150.0000 mg | ORAL_TABLET | Freq: Every day | ORAL | 0 refills | Status: DC

## 2021-05-12 NOTE — Telephone Encounter (Signed)
Yes send in 3 days worth of each of these medications as soon as possible.

## 2021-05-12 NOTE — Telephone Encounter (Signed)
Rx sent 

## 2021-05-12 NOTE — Telephone Encounter (Signed)
Next visit is 11/06/21. Corey Reilly is traveling to [place], [place] and left his Lithium and Seroquel at the house. Could he got 3 days worth of both of these meds called in to the [facility], [place], [place], [place] [address]. ([address]) Phone number is [phone]. He just needs enough called in for the days he is in [place].

## 2021-05-12 NOTE — Telephone Encounter (Signed)
Please review

## 2021-05-19 ENCOUNTER — Other Ambulatory Visit (HOSPITAL_COMMUNITY): Payer: Self-pay

## 2021-05-20 ENCOUNTER — Other Ambulatory Visit (HOSPITAL_COMMUNITY): Payer: Self-pay

## 2021-06-03 ENCOUNTER — Other Ambulatory Visit (HOSPITAL_COMMUNITY): Payer: Self-pay

## 2021-06-04 ENCOUNTER — Other Ambulatory Visit (HOSPITAL_COMMUNITY): Payer: Self-pay

## 2021-06-04 DIAGNOSIS — Z79899 Other long term (current) drug therapy: Secondary | ICD-10-CM | POA: Diagnosis not present

## 2021-06-04 DIAGNOSIS — F3110 Bipolar disorder, current episode manic without psychotic features, unspecified: Secondary | ICD-10-CM | POA: Diagnosis not present

## 2021-06-05 LAB — LITHIUM LEVEL: Lithium Lvl: 0.9 mmol/L (ref 0.6–1.2)

## 2021-06-05 LAB — BASIC METABOLIC PANEL
BUN: 7 mg/dL (ref 7–25)
CO2: 28 mmol/L (ref 20–32)
Calcium: 9.6 mg/dL (ref 8.6–10.3)
Chloride: 105 mmol/L (ref 98–110)
Creat: 1.27 mg/dL (ref 0.70–1.30)
Glucose, Bld: 104 mg/dL (ref 65–139)
Potassium: 4.1 mmol/L (ref 3.5–5.3)
Sodium: 139 mmol/L (ref 135–146)

## 2021-06-05 LAB — TSH: TSH: 1.96 mIU/L (ref 0.40–4.50)

## 2021-06-05 NOTE — Progress Notes (Signed)
Lithium level stable 0.9. Creatinine now in normal range 1.27 down from 1.56 a year ago.

## 2021-06-09 ENCOUNTER — Other Ambulatory Visit: Payer: Self-pay | Admitting: Urology

## 2021-06-09 ENCOUNTER — Other Ambulatory Visit (HOSPITAL_COMMUNITY): Payer: Self-pay

## 2021-06-10 ENCOUNTER — Other Ambulatory Visit (HOSPITAL_COMMUNITY): Payer: Self-pay

## 2021-06-10 MED ORDER — ALFUZOSIN HCL ER 10 MG PO TB24
10.0000 mg | ORAL_TABLET | Freq: Two times a day (BID) | ORAL | 0 refills | Status: DC
Start: 2021-06-10 — End: 2021-09-05
  Filled 2021-06-10: qty 180, 90d supply, fill #0

## 2021-06-23 ENCOUNTER — Other Ambulatory Visit (HOSPITAL_COMMUNITY): Payer: Self-pay

## 2021-06-23 MED ORDER — QUETIAPINE FUMARATE 50 MG PO TABS
150.0000 mg | ORAL_TABLET | Freq: Every day | ORAL | 3 refills | Status: DC
  Filled 2021-06-23: qty 270, 90d supply, fill #0
  Filled 2021-09-24: qty 180, 60d supply, fill #1

## 2021-07-01 ENCOUNTER — Other Ambulatory Visit (HOSPITAL_COMMUNITY): Payer: Self-pay

## 2021-07-03 ENCOUNTER — Other Ambulatory Visit (HOSPITAL_COMMUNITY): Payer: Self-pay

## 2021-07-04 DIAGNOSIS — Z23 Encounter for immunization: Secondary | ICD-10-CM | POA: Diagnosis not present

## 2021-07-14 ENCOUNTER — Other Ambulatory Visit: Payer: Self-pay | Admitting: Psychiatry

## 2021-07-14 ENCOUNTER — Other Ambulatory Visit (HOSPITAL_COMMUNITY): Payer: Self-pay

## 2021-07-14 DIAGNOSIS — F319 Bipolar disorder, unspecified: Secondary | ICD-10-CM

## 2021-07-16 ENCOUNTER — Other Ambulatory Visit (HOSPITAL_COMMUNITY): Payer: Self-pay

## 2021-07-18 ENCOUNTER — Other Ambulatory Visit (HOSPITAL_COMMUNITY): Payer: Self-pay

## 2021-07-18 ENCOUNTER — Telehealth: Payer: Self-pay | Admitting: Psychiatry

## 2021-07-18 ENCOUNTER — Other Ambulatory Visit: Payer: Self-pay

## 2021-07-18 DIAGNOSIS — F319 Bipolar disorder, unspecified: Secondary | ICD-10-CM

## 2021-07-18 MED ORDER — QUETIAPINE FUMARATE 50 MG PO TABS
150.0000 mg | ORAL_TABLET | Freq: Every day | ORAL | 0 refills | Status: DC
  Filled 2021-07-18 – 2021-12-02 (×2): qty 90, 30d supply, fill #0

## 2021-07-18 MED ORDER — LITHIUM CARBONATE ER 450 MG PO TBCR
450.0000 mg | EXTENDED_RELEASE_TABLET | Freq: Two times a day (BID) | ORAL | 0 refills | Status: DC
  Filled 2021-07-18 (×2): qty 60, 30d supply, fill #0

## 2021-07-18 NOTE — Telephone Encounter (Signed)
Next visit is 11/06/21. Corey Reilly called and is leaving on a plane in 2 hours to go out of town. He said he called this week requesting the refills to be called but it was not done. Requesting a refill on Lithium and Seroquel.  ? ?Pharmacy: ? ?[facility] ? ?Phone:  [phone]  ?Fax:  [phone]  ? ? ? ?

## 2021-07-18 NOTE — Telephone Encounter (Signed)
Pt informed rx sent.

## 2021-08-05 ENCOUNTER — Other Ambulatory Visit (HOSPITAL_COMMUNITY): Payer: Self-pay

## 2021-08-13 ENCOUNTER — Ambulatory Visit: Payer: 59 | Attending: Family Medicine | Admitting: Pharmacist

## 2021-08-13 ENCOUNTER — Telehealth: Payer: Self-pay | Admitting: Pharmacist

## 2021-08-13 DIAGNOSIS — Z79899 Other long term (current) drug therapy: Secondary | ICD-10-CM

## 2021-08-13 NOTE — Telephone Encounter (Signed)
Called patient to schedule an appointment for the [facility]. I was unable to reach the patient so I left a HIPAA-compliant message requesting that the patient return my call.   Luke Van Ausdall, PharmD, BCACP, CPP Clinical Pharmacist [facility] [phone]  

## 2021-08-13 NOTE — Progress Notes (Signed)
S: ?Patient presents today to the [facility]. ? ?Patient is currently taking Humira for psoriasis. Patient is managed by Dr. Denna Haggard for this.  ? ?Efficacy: still continues to work well. ? ?Adherence: confirmed  ? ?Dosing:  ?Plaque psoriasis: SubQ: ?Maintenance: 40 mg every other week  ? ?Drug-drug interactions: none ? ?Screening: ?TB test: completed per patient ?Hepatitis: completed ? ?Monitoring: ?S/sx of infection: denies ?CBC: WNL per patient ?S/sx of hypersensitivity: denies ?S/sx of malignancy: denies ?S/sx of heart failure: denies ? ?O: ?   ? ?Lab Results  ?Component Value Date  ? WBC 19.2 (H) 05/21/2011  ? HGB 16.7 05/21/2011  ? HCT 46.1 05/21/2011  ? MCV 93.9 05/21/2011  ? PLT 216 05/21/2011  ? ? ?  Chemistry   ?   ?Component Value Date/Time  ? NA 139 06/04/2021 1128  ? K 4.1 06/04/2021 1128  ? CL 105 06/04/2021 1128  ? CO2 28 06/04/2021 1128  ? BUN 7 06/04/2021 1128  ? CREATININE 1.27 06/04/2021 1128  ?    ?Component Value Date/Time  ? CALCIUM 9.6 06/04/2021 1128  ? ALKPHOS 87 05/21/2011 1753  ? AST 48 (H) 05/21/2011 1753  ? ALT 37 05/21/2011 1753  ? BILITOT 2.5 (H) 05/21/2011 1753  ?  ? ? ?A/P: ?1. Medication review: Patient on Humira for psoriasis and continues to do well on it. Reviewed the medication with the patient, including the following: Humira is a TNF blocking agent indicated for ankylosing spondylitis, Crohn's disease, Hidradenitis suppurativa, psoriatic arthritis, plaque psoriasis, ulcerative colitis, and uveitis. The most common adverse effects are infections, headache, and injection site reactions. There is the possibility of an increased risk of malignancy but it is not well understood if this increased risk is due to there medication or the disease state. There are rare cases of pancytopenia and aplastic anemia. No recommendations for changes.  ? ?Benard Halsted, PharmD, BCACP, CPP ?Clinical Pharmacist ?[place] ?[phone] ? ? ? ? ?

## 2021-08-14 ENCOUNTER — Other Ambulatory Visit: Payer: Self-pay | Admitting: Psychiatry

## 2021-08-14 ENCOUNTER — Other Ambulatory Visit (HOSPITAL_COMMUNITY): Payer: Self-pay

## 2021-08-14 DIAGNOSIS — F319 Bipolar disorder, unspecified: Secondary | ICD-10-CM

## 2021-08-14 MED ORDER — LITHIUM CARBONATE ER 450 MG PO TBCR
450.0000 mg | EXTENDED_RELEASE_TABLET | Freq: Two times a day (BID) | ORAL | 0 refills | Status: DC
  Filled 2021-08-14: qty 180, 90d supply, fill #0

## 2021-08-21 ENCOUNTER — Other Ambulatory Visit (HOSPITAL_COMMUNITY): Payer: Self-pay

## 2021-08-28 ENCOUNTER — Other Ambulatory Visit (HOSPITAL_COMMUNITY): Payer: Self-pay

## 2021-09-01 ENCOUNTER — Other Ambulatory Visit (HOSPITAL_COMMUNITY): Payer: Self-pay

## 2021-09-04 ENCOUNTER — Other Ambulatory Visit (HOSPITAL_COMMUNITY): Payer: Self-pay

## 2021-09-05 ENCOUNTER — Other Ambulatory Visit: Payer: Self-pay | Admitting: Urology

## 2021-09-05 ENCOUNTER — Other Ambulatory Visit (HOSPITAL_COMMUNITY): Payer: Self-pay

## 2021-09-08 ENCOUNTER — Other Ambulatory Visit (HOSPITAL_COMMUNITY): Payer: Self-pay

## 2021-09-08 ENCOUNTER — Other Ambulatory Visit: Payer: Self-pay | Admitting: Urology

## 2021-09-10 ENCOUNTER — Other Ambulatory Visit (HOSPITAL_COMMUNITY): Payer: Self-pay

## 2021-09-11 ENCOUNTER — Other Ambulatory Visit (HOSPITAL_COMMUNITY): Payer: Self-pay

## 2021-09-11 MED ORDER — ALFUZOSIN HCL ER 10 MG PO TB24
10.0000 mg | ORAL_TABLET | Freq: Two times a day (BID) | ORAL | 0 refills | Status: DC
  Filled 2021-09-11: qty 180, 90d supply, fill #0

## 2021-09-18 ENCOUNTER — Other Ambulatory Visit (HOSPITAL_COMMUNITY): Payer: Self-pay

## 2021-09-18 ENCOUNTER — Other Ambulatory Visit: Payer: Self-pay | Admitting: Psychiatry

## 2021-09-18 DIAGNOSIS — F902 Attention-deficit hyperactivity disorder, combined type: Secondary | ICD-10-CM

## 2021-09-18 MED ORDER — LISDEXAMFETAMINE DIMESYLATE 50 MG PO CAPS
50.0000 mg | ORAL_CAPSULE | Freq: Every day | ORAL | 0 refills | Status: DC
  Filled 2021-09-18 – 2021-09-19 (×2): qty 90, 90d supply, fill #0

## 2021-09-19 ENCOUNTER — Other Ambulatory Visit (HOSPITAL_COMMUNITY): Payer: Self-pay

## 2021-09-24 ENCOUNTER — Other Ambulatory Visit (HOSPITAL_COMMUNITY): Payer: Self-pay

## 2021-09-30 ENCOUNTER — Other Ambulatory Visit (HOSPITAL_COMMUNITY): Payer: Self-pay

## 2021-10-23 ENCOUNTER — Other Ambulatory Visit (HOSPITAL_COMMUNITY): Payer: Self-pay

## 2021-10-28 ENCOUNTER — Other Ambulatory Visit (HOSPITAL_COMMUNITY): Payer: Self-pay

## 2021-10-29 ENCOUNTER — Other Ambulatory Visit (HOSPITAL_COMMUNITY): Payer: Self-pay

## 2021-10-29 MED ORDER — PEG 3350-KCL-NA BICARB-NACL 420 G PO SOLR
ORAL | 0 refills | Status: DC
  Filled 2021-10-29: qty 4000, 1d supply, fill #0

## 2021-10-30 ENCOUNTER — Other Ambulatory Visit (HOSPITAL_COMMUNITY): Payer: Self-pay

## 2021-11-04 DIAGNOSIS — Z86010 Personal history of colonic polyps: Secondary | ICD-10-CM | POA: Diagnosis not present

## 2021-11-04 DIAGNOSIS — K5730 Diverticulosis of large intestine without perforation or abscess without bleeding: Secondary | ICD-10-CM | POA: Diagnosis not present

## 2021-11-06 ENCOUNTER — Encounter: Payer: Self-pay | Admitting: Psychiatry

## 2021-11-06 ENCOUNTER — Ambulatory Visit (INDEPENDENT_AMBULATORY_CARE_PROVIDER_SITE_OTHER): Payer: 59 | Admitting: Psychiatry

## 2021-11-06 ENCOUNTER — Other Ambulatory Visit (HOSPITAL_COMMUNITY): Payer: Self-pay

## 2021-11-06 VITALS — BP 162/88 | HR 79

## 2021-11-06 DIAGNOSIS — F902 Attention-deficit hyperactivity disorder, combined type: Secondary | ICD-10-CM

## 2021-11-06 DIAGNOSIS — F319 Bipolar disorder, unspecified: Secondary | ICD-10-CM | POA: Diagnosis not present

## 2021-11-06 DIAGNOSIS — Z79899 Other long term (current) drug therapy: Secondary | ICD-10-CM | POA: Diagnosis not present

## 2021-11-06 DIAGNOSIS — F3110 Bipolar disorder, current episode manic without psychotic features, unspecified: Secondary | ICD-10-CM

## 2021-11-06 MED ORDER — LITHIUM CARBONATE ER 450 MG PO TBCR
450.0000 mg | EXTENDED_RELEASE_TABLET | Freq: Two times a day (BID) | ORAL | 1 refills | Status: DC
  Filled 2021-11-06: qty 180, 90d supply, fill #0

## 2021-11-06 MED ORDER — LISDEXAMFETAMINE DIMESYLATE 50 MG PO CAPS
50.0000 mg | ORAL_CAPSULE | Freq: Every day | ORAL | 0 refills | Status: DC
  Filled 2021-11-06: qty 90, 90d supply, fill #0

## 2021-11-06 MED ORDER — QUETIAPINE FUMARATE 50 MG PO TABS
150.0000 mg | ORAL_TABLET | Freq: Every day | ORAL | 3 refills | Status: DC
  Filled 2021-11-06: qty 270, 90d supply, fill #0

## 2021-11-06 NOTE — Progress Notes (Signed)
Corey Reilly [id number] [birth year] 61 y.o.  Subjective:   Patient ID:  Corey Reilly is a 61 y.o. (DOB [birth year]) male.  Chief Complaint:  Chief Complaint  Patient presents with   Follow-up    Bipolar I disorder, most recent episode (or current) manic ([place])   ADHD    Depression        Associated symptoms include no decreased concentration and no suicidal ideas.  Corey Reilly presents to the office today for follow-up of bipolar disorder and concerns about focus and possible ADD.  When seen December 2019.  No meds were changed.  Labs were received with low normal lithium level.  Creatinine was slightly elevated at 1.4.  When seen in June.  No meds were changed.  Was recommended that he try NAC 600 mg daily for cognition.  Completed ADHD self questionnaire which was high on both the inattention and hyperactivity scales.  Tried NAC 1000 mg with less memory issues but not dramatic.  Does think it's improved but not enough and wanted to pursue a trial of stimulants.  This was discussed with his wife who noted the following: Periods of poor function intermittently.  Gets distracted and then forgets.  Cannot control it.  Wonders if it's ADD.  D dx ADHD and the 2 of them are similar and she is on Adderall.  At [employer].  Constantly distracted.  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  Therefore at the visit in August we initiated a trial of Vyvanse 30 mg daily. He called back September 3 asking for refills stating it was working well.  He did increase Vyvanse to 40 mg daily.  seen February 06, 2019.  Rec trial NAC 1000 mg for STM and continue Vyvanse 30 as best tolerated dose.  No other med changes.  Overall doing OK.  Expected stress with Covid and politics.  Mental health is OK. Doesn't feel the Vyvanse in any kick or mood but focus is definitely better.  Last month is best month ever and much mor productive and efficient.  Lost a little weight on it.  SE low appetite so  skips on the weekend.  Less distractible with Vyvanse and only uses work days.    seen May 09, 2019.  No meds were changed.  08/07/19 appt with the following noted: He increased the lithium dose back to 900 mg daily about first of April, because he was having some manic and depressive symptoms.  Lithium level requested was 0.6 at lower dosage of 675 mg daily.. Mood the last few days is OK.  Dealing with some pain in TMJ that is affecting him. 2 nights of no sleep but otherwise normal sleep. Has used more caffeine than usual but only in the morning.    Patient reports stable mood and denies depressed or irritable moods.  Patient denies any recent difficulty with anxiety.  Patient denies difficulty with sleep initiation or maintenance. Denies appetite disturbance.  Patient reports that energy and motivation have been good.  Patient denies any difficulty with concentration.  Patient denies any suicidal ideation. Plan: He is not manic and no longer depressed after increase lithium to 900 mg daily.  stable even with Vyvanse and doing well with lithium  12/06/19 appt with the following noted: All in all well.  Not sign manic.  Stayed on lithium 900.  Sleep good on 50 mg daily 7-8 hours.  No SE except  Not hungry on Vyvanse.  05/08/2020 appt noted: Mood OK  overall.  But got worked up too much last week for about 3 hours but it only lasted those hours. Some problems with sleep without apparent reason though occ forgets it. Sometimes a night with no sleep 1-2 times a month.  Usually good sleep routine. Melatonin and benadryl  Health is good. Plan: Get labs as soon as possible  06/14/2020 phone call with patient:  Patient called because of emerging mania. MD response: Return telephone call to patient: Had information indirectly from his wife that the patient is manic. Patient reports only 1 hour of sleep last night, impulsive somewhat reckless decision making with poor concentration.  He feels revved  up even and is up in his body at night.  He has been compliant with medications. No psychotic symptoms and no suicidal thoughts or homicidal thoughts. Discussed that the patient is having manic symptoms that are breaking through despite adequate lithium level of 1.0 just a few days ago.  Therefore going up and lithium is not a good option.  He is taking quetiapine 50 mg nightly.  Discussed the pros and cons of increasing quetiapine to manage this acute mania versus adding Depakote which is the other more common mood stabilizer.  It is likely that increasing quetiapine will work quicker because he will start sleeping better almost right away probably.  Discussed the dosing range of Seroquel for mania is 300 to 800 mg nightly.  Discussed the side effects of Seroquel. Increase Seroquel to 300 mg nightly and continue lithium. Schedule follow-up soon We will discussed the option of transitioning him to Depakote as an alternative to the lithium at his appointment.  07/11/2020 appointment with the following noted:  Did the job with mood stab ilizing.  But was pretty hungover and reduced the dose to 150 mg HS and it seems fine.  Denies any manic sx and doesn't think wife sees him manic now.  Sleep is fine about 7-8 hours.   SE a bit harder getting up and going int he morning.  No clear triggers. Wife Corey Reilly thought his manic sx lasted about 4 weeks with volatility and difficulty functioning with work.  Hyperverbal and overinclusive and hypergraphia.  Noticed by others but resolved.  Couldn't stop himself.  Didn't really recognize mania until pointed out to him. Plan: OK increase Vyvanse 50 mg daily.   11/05/2020 appointment with the following noted: Doing well overall home and work and life fine.  Vyvanse helpful.   Continue meds.  Tried microdosing mushrooms over 6 weeks and it seemed to help.  Helped him be a better person.   Plan: Needed increase Seroquel to 300 mg HS for recent mania not prevented by  lithium .  But he says it's too sedating so dropped to 150.  . not depressed after increase lithium to 900 mg daily.   Continue Vyvanse 50 mg every morning  05/08/2021 appointment with the following noted: Never better as an adult. Taking low dose Seroquel 75 mg HS.  Sleeping well for the most part. No SE with meds. Satisfied with meds.  Benefit. Health has been good. Plan: For convenience we will reduce Seroquel to 50 mg nightly.  And also for tolerability. . not depressed after increase lithium to 900 mg daily.   Continue Vyvanse 50 mg every morning Check lithium labs  11/06/2021 appointment with the following noted: Doing well.  Business growing fast.  Hanging in there well.  No mood swings nor depression. Sleeping good with quetiapine 150 mg HS Continues lithium  900 and Vyvanse 50     Patient reports stable mood and denies depressed or irritable moods.  Patient denies any recent difficulty with anxiety.  Patient denies difficulty with sleep initiation or maintenance. Denies appetite disturbance.  Patient reports that energy and motivation have been good.  Patient denies any difficulty with concentration.  Patient denies any suicidal ideation.  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 D doesn't drink bc pt has hx of alcohol dependence.  He's been sober for many years.  Attention problems.  Talked to wife.    Past psychiatric medication trials include Geodon which caused sedation, Zyprexa which caused sedation, Latuda which caused akathisia and Seroquel Vyvanse Found out Aunt has bipolar disabled.  Review of Systems:  Review of Systems  Respiratory:  Negative for shortness of breath.   Cardiovascular:  Negative for chest pain and palpitations.  Skin:  Positive for rash.  Neurological:  Negative for dizziness, tremors, weakness and light-headedness.  Psychiatric/Behavioral:  Negative for agitation, behavioral problems, confusion, decreased  concentration, dysphoric mood, hallucinations, self-injury, sleep disturbance and suicidal ideas. The patient is not nervous/anxious and is not hyperactive.     Medications: I have reviewed the patient's current medications.  Current Outpatient Medications  Medication Sig Dispense Refill   Adalimumab 40 MG/0.8ML PNKT INJECT 40 MG INTO THE SKIN EVERY 14 DAYS. 2 each 8   alfuzosin (UROXATRAL) 10 MG 24 hr tablet Take 1 tablet (10 mg total) by mouth 2 (two) times daily. 180 tablet 0   atorvastatin (LIPITOR) 20 MG tablet Take 10 mg by mouth daily.  11   atorvastatin (LIPITOR) 20 MG tablet Take 1 tablet (20 mg total) by mouth daily. 90 tablet 3   polyethylene glycol-electrolytes (NULYTELY) 420 g solution Use as directed. 4000 mL 0   QUEtiapine (SEROQUEL) 50 MG tablet Take 3 tablets (150 mg total) by mouth at bedtime. 90 tablet 0   atorvastatin (LIPITOR) 20 MG tablet TAKE 1 TABLET BY MOUTH ONCE A DAY 90 tablet 3   lisdexamfetamine (VYVANSE) 50 MG capsule Take 1 capsule (50 mg total) by mouth every morning 90 capsule 0   lithium carbonate (ESKALITH) 450 MG CR tablet Take 1 tablet (450 mg total) by mouth 2 (two) times daily. 180 tablet 1   QUEtiapine (SEROQUEL) 50 MG tablet Take 3 tablets (150 mg total) by mouth at bedtime. 270 tablet 3   No current facility-administered medications for this visit.    Medication Side Effects: None  Allergies: No Known Allergies  Past Medical History:  Diagnosis Date   Bipolar disorder ([place])    Depression    Psoriasis    Ulcer     Family History  Problem Relation Age of Onset   Hyperlipidemia Father    COPD Maternal Uncle     Social History   Socioeconomic History   Marital status: Married    Spouse name: Not on file   Number of children: Not on file   Years of education: Not on file   Highest education level: Not on file  Occupational History   Not on file  Tobacco Use   Smoking status: Never   Smokeless tobacco: Never  Substance and Sexual  Activity   Alcohol use: Not on file   Drug use: Not on file   Sexual activity: Not on file  Other Topics Concern   Not on file  Social History Narrative   Not on file   Social Determinants of Health  Financial Resource Strain: Not on file  Food Insecurity: Not on file  Transportation Needs: Not on file  Physical Activity: Not on file  Stress: Not on file  Social Connections: Not on file  Intimate Partner Violence: Not on file    Past Medical History, Surgical history, Social history, and Family history were reviewed and updated as appropriate.   Please see review of systems for further details on the patient's review from today.   Objective:   Physical Exam:  BP (!) 162/88   Pulse 79   Physical Exam Constitutional:      General: He is not in acute distress. Musculoskeletal:        General: No deformity.  Neurological:     Mental Status: He is alert and oriented to person, place, and time.     Cranial Nerves: No dysarthria.     Coordination: Coordination normal.  Psychiatric:        Attention and Perception: Attention and perception normal. He does not perceive auditory or visual hallucinations.        Mood and Affect: Mood normal. Mood is not anxious, depressed or elated. Affect is not labile or angry.        Speech: Speech normal. Speech is not rapid and pressured or slurred.        Behavior: Behavior normal. Behavior is not agitated. Behavior is cooperative.        Thought Content: Thought content normal. Thought content is not delusional. Thought content does not include homicidal or suicidal ideation. Thought content does not include suicidal plan.        Cognition and Memory: Cognition and memory normal.        Judgment: Judgment normal.     Comments: Insight intact     Lab Review:     Component Value Date/Time   NA 139 06/04/2021 1128   K 4.1 06/04/2021 1128   CL 105 06/04/2021 1128   CO2 28 06/04/2021 1128   GLUCOSE 104 06/04/2021 1128   BUN 7  06/04/2021 1128   CREATININE 1.27 06/04/2021 1128   CALCIUM 9.6 06/04/2021 1128   PROT 8.3 05/21/2011 1753   ALBUMIN 4.4 05/21/2011 1753   AST 48 (H) 05/21/2011 1753   ALT 37 05/21/2011 1753   ALKPHOS 87 05/21/2011 1753   BILITOT 2.5 (H) 05/21/2011 1753   GFRNONAA 79 (L) 05/21/2011 1753   GFRAA >90 05/21/2011 1753       Component Value Date/Time   WBC 19.2 (H) 05/21/2011 1753   RBC 4.91 05/21/2011 1753   HGB 16.7 05/21/2011 1753   HCT 46.1 05/21/2011 1753   PLT 216 05/21/2011 1753   MCV 93.9 05/21/2011 1753   MCH 34.0 05/21/2011 1753   MCHC 36.2 (H) 05/21/2011 1753   RDW 13.0 05/21/2011 1753   LYMPHSABS 0.8 05/21/2011 1753   MONOABS 1.2 (H) 05/21/2011 1753   EOSABS 0.0 05/21/2011 1753   BASOSABS 0.0 05/21/2011 1753   lithium level October 21, 2017 0.4.  This was on 675 mg a day same his current dosage.  His level in November 18 was 1.10 900 mg a day.  The dosage was reduced because of elevated creatinine which in June was 1.53. Lithium Lvl  Date Value Ref Range Status  06/04/2021 0.9 0.6 - 1.2 mmol/L Final   This was on 900 mg lithium daily.  No results found for: "PHENYTOIN", "PHENOBARB", "VALPROATE", "CBMZ"   Adult self-report scale for ADD score was 29 on inattentive side which is high  and 23 on the hyperactive scale which is borderline high suggestive of ADHD  .res Assessment: Plan:    Corey Reilly was seen today for follow-up and adhd.  Diagnoses and all orders for this visit:  Bipolar I disorder, most recent episode (or current) manic ([facility]) -     QUEtiapine (SEROQUEL) 50 MG tablet; Take 3 tablets (150 mg total) by mouth at bedtime. -     Lithium level -     Basic metabolic panel  Attention deficit hyperactivity disorder (ADHD), combined type -     lisdexamfetamine (VYVANSE) 50 MG capsule; Take 1 capsule (50 mg total) by mouth every morning  Lithium use -     Basic metabolic panel  Bipolar I disorder (HCC) -     lithium carbonate (ESKALITH) 450 MG CR tablet;  Take 1 tablet (450 mg total) by mouth 2 (two) times daily.   Bipolar disorder under good control with lithium.     Hx borderline creatinine. Counseled patient regarding potential benefits, risks, and side effects of lithium to include potential risk of lithium affecting thyroid and renal function.  Discussed need for periodic lab monitoring to determine drug level and to assess for potential adverse effects.  Counseled patient regarding signs and symptoms of lithium toxicity and advised that they notify office immediately or seek urgent medical attention if experiencing these signs and symptoms.  Patient advised to contact office with any questions or concerns.   Check labs again bc is due.  Needed increase Seroquel to 300 mg HS for recent mania not prevented by lithium in the past but has been stable on just 75 mg nightly as needed for sleep over the last several months..  He was too sedated on Seroquel 300 mg nightly. Continue Seroquel 150 mg nightly.  And also for tolerability. . not depressed after increase lithium to 900 mg daily.   Overall reports that during this last year his mood has been the best it has ever been.  Vyvanse 50 mg daily. Good responsel  He is only taking it on workdays and on heavy work days occasionally doubles up.  Discussed the risk with this because that is higher than the usual recommended dose.  However he is not taking it at all on weekends or days that he is not working.  Productivity better and better on task with Vyvanse  PDMP is clear Disc shortage of vyvanse and dosing.  Call if any recurrence of mania.  He feels capable of recognizing the symptoms.  FU  6 mos   Lynder Parents, MD, DFAPA   Please see After Visit Summary for patient specific instructions.  Future Appointments  Date Time Provider [place]  03/30/2022  1:45 PM Lavonna Monarch, MD [facility]     Orders Placed This Encounter  Procedures   Lithium level   Basic metabolic panel       -------------------------------

## 2021-11-07 ENCOUNTER — Other Ambulatory Visit (HOSPITAL_COMMUNITY): Payer: Self-pay

## 2021-11-10 ENCOUNTER — Other Ambulatory Visit (HOSPITAL_COMMUNITY): Payer: Self-pay

## 2021-11-11 ENCOUNTER — Other Ambulatory Visit (HOSPITAL_COMMUNITY): Payer: Self-pay

## 2021-11-12 ENCOUNTER — Other Ambulatory Visit (HOSPITAL_COMMUNITY): Payer: Self-pay

## 2021-11-17 ENCOUNTER — Other Ambulatory Visit (HOSPITAL_COMMUNITY): Payer: Self-pay

## 2021-12-01 ENCOUNTER — Other Ambulatory Visit (HOSPITAL_COMMUNITY): Payer: Self-pay

## 2021-12-02 ENCOUNTER — Other Ambulatory Visit (HOSPITAL_COMMUNITY): Payer: Self-pay

## 2021-12-04 ENCOUNTER — Other Ambulatory Visit (HOSPITAL_COMMUNITY): Payer: Self-pay

## 2021-12-12 ENCOUNTER — Other Ambulatory Visit (HOSPITAL_COMMUNITY): Payer: Self-pay

## 2021-12-12 MED ORDER — LITHIUM CARBONATE ER 450 MG PO TBCR
450.0000 mg | EXTENDED_RELEASE_TABLET | Freq: Two times a day (BID) | ORAL | 1 refills | Status: DC
  Filled 2021-12-12: qty 120, 60d supply, fill #0
  Filled 2022-02-09: qty 120, 60d supply, fill #1
  Filled 2022-04-07: qty 120, 60d supply, fill #2

## 2021-12-15 DIAGNOSIS — F3110 Bipolar disorder, current episode manic without psychotic features, unspecified: Secondary | ICD-10-CM | POA: Diagnosis not present

## 2021-12-15 DIAGNOSIS — Z79899 Other long term (current) drug therapy: Secondary | ICD-10-CM | POA: Diagnosis not present

## 2021-12-16 LAB — BASIC METABOLIC PANEL
BUN/Creatinine Ratio: 5 (calc) — ABNORMAL LOW (ref 6–22)
BUN: 8 mg/dL (ref 7–25)
CO2: 27 mmol/L (ref 20–32)
Calcium: 9.8 mg/dL (ref 8.6–10.3)
Chloride: 103 mmol/L (ref 98–110)
Creat: 1.48 mg/dL — ABNORMAL HIGH (ref 0.70–1.30)
Glucose, Bld: 89 mg/dL (ref 65–99)
Potassium: 4.1 mmol/L (ref 3.5–5.3)
Sodium: 141 mmol/L (ref 135–146)

## 2021-12-16 LAB — LITHIUM LEVEL: Lithium Lvl: 1 mmol/L (ref 0.6–1.2)

## 2021-12-17 ENCOUNTER — Other Ambulatory Visit: Payer: Self-pay | Admitting: Urology

## 2021-12-18 ENCOUNTER — Other Ambulatory Visit (HOSPITAL_COMMUNITY): Payer: Self-pay

## 2021-12-18 MED ORDER — ALFUZOSIN HCL ER 10 MG PO TB24
10.0000 mg | ORAL_TABLET | Freq: Two times a day (BID) | ORAL | 0 refills | Status: DC
  Filled 2021-12-18: qty 180, 90d supply, fill #0

## 2021-12-22 ENCOUNTER — Other Ambulatory Visit (HOSPITAL_COMMUNITY): Payer: Self-pay

## 2021-12-22 ENCOUNTER — Other Ambulatory Visit: Payer: Self-pay | Admitting: Psychiatry

## 2021-12-22 DIAGNOSIS — F902 Attention-deficit hyperactivity disorder, combined type: Secondary | ICD-10-CM

## 2021-12-22 MED ORDER — LISDEXAMFETAMINE DIMESYLATE 50 MG PO CAPS
50.0000 mg | ORAL_CAPSULE | Freq: Every day | ORAL | 0 refills | Status: DC
  Filled 2021-12-22: qty 90, 90d supply, fill #0

## 2021-12-23 ENCOUNTER — Other Ambulatory Visit (HOSPITAL_COMMUNITY): Payer: Self-pay

## 2022-01-07 ENCOUNTER — Other Ambulatory Visit (HOSPITAL_COMMUNITY): Payer: Self-pay

## 2022-01-07 ENCOUNTER — Other Ambulatory Visit: Payer: Self-pay | Admitting: Psychiatry

## 2022-01-07 DIAGNOSIS — F319 Bipolar disorder, unspecified: Secondary | ICD-10-CM

## 2022-01-08 ENCOUNTER — Other Ambulatory Visit (HOSPITAL_COMMUNITY): Payer: Self-pay

## 2022-01-08 MED ORDER — QUETIAPINE FUMARATE 50 MG PO TABS
150.0000 mg | ORAL_TABLET | Freq: Every day | ORAL | 1 refills | Status: DC
  Filled 2022-01-08: qty 90, 30d supply, fill #0
  Filled 2022-02-02: qty 90, 30d supply, fill #1

## 2022-01-21 ENCOUNTER — Other Ambulatory Visit (HOSPITAL_COMMUNITY): Payer: Self-pay

## 2022-02-02 ENCOUNTER — Other Ambulatory Visit (HOSPITAL_COMMUNITY): Payer: Self-pay

## 2022-02-04 ENCOUNTER — Other Ambulatory Visit (HOSPITAL_COMMUNITY): Payer: Self-pay

## 2022-02-10 ENCOUNTER — Other Ambulatory Visit (HOSPITAL_COMMUNITY): Payer: Self-pay

## 2022-03-02 ENCOUNTER — Other Ambulatory Visit (HOSPITAL_COMMUNITY): Payer: Self-pay

## 2022-03-12 ENCOUNTER — Other Ambulatory Visit (HOSPITAL_COMMUNITY): Payer: Self-pay

## 2022-03-12 ENCOUNTER — Other Ambulatory Visit: Payer: Self-pay

## 2022-03-12 MED ORDER — ATORVASTATIN CALCIUM 20 MG PO TABS
20.0000 mg | ORAL_TABLET | Freq: Every day | ORAL | 4 refills | Status: DC
  Filled 2022-03-12 – 2022-06-05 (×2): qty 90, 90d supply, fill #0
  Filled 2022-09-03: qty 90, 90d supply, fill #1
  Filled 2022-12-01: qty 90, 90d supply, fill #2
  Filled 2023-03-02: qty 90, 90d supply, fill #3

## 2022-03-16 ENCOUNTER — Other Ambulatory Visit (HOSPITAL_COMMUNITY): Payer: Self-pay

## 2022-03-16 ENCOUNTER — Other Ambulatory Visit: Payer: Self-pay | Admitting: Urology

## 2022-03-17 ENCOUNTER — Other Ambulatory Visit (HOSPITAL_COMMUNITY): Payer: Self-pay

## 2022-03-20 ENCOUNTER — Other Ambulatory Visit (HOSPITAL_COMMUNITY): Payer: Self-pay

## 2022-03-22 MED ORDER — ALFUZOSIN HCL ER 10 MG PO TB24
10.0000 mg | ORAL_TABLET | Freq: Two times a day (BID) | ORAL | 0 refills | Status: DC
  Filled 2022-03-22: qty 180, 90d supply, fill #0

## 2022-03-23 ENCOUNTER — Other Ambulatory Visit (HOSPITAL_COMMUNITY): Payer: Self-pay

## 2022-03-25 ENCOUNTER — Other Ambulatory Visit: Payer: Self-pay | Admitting: Psychiatry

## 2022-03-25 DIAGNOSIS — F902 Attention-deficit hyperactivity disorder, combined type: Secondary | ICD-10-CM

## 2022-03-25 MED ORDER — LISDEXAMFETAMINE DIMESYLATE 50 MG PO CAPS
50.0000 mg | ORAL_CAPSULE | Freq: Every day | ORAL | 0 refills | Status: DC
  Filled 2022-03-25: qty 90, 90d supply, fill #0

## 2022-03-26 ENCOUNTER — Other Ambulatory Visit (HOSPITAL_COMMUNITY): Payer: Self-pay

## 2022-03-30 ENCOUNTER — Ambulatory Visit: Payer: 59 | Admitting: Dermatology

## 2022-03-31 ENCOUNTER — Other Ambulatory Visit: Payer: Self-pay | Admitting: Psychiatry

## 2022-03-31 DIAGNOSIS — F319 Bipolar disorder, unspecified: Secondary | ICD-10-CM

## 2022-04-01 ENCOUNTER — Other Ambulatory Visit (HOSPITAL_COMMUNITY): Payer: Self-pay

## 2022-04-01 MED ORDER — QUETIAPINE FUMARATE 50 MG PO TABS
150.0000 mg | ORAL_TABLET | Freq: Every day | ORAL | 0 refills | Status: DC
  Filled 2022-04-01: qty 90, 30d supply, fill #0

## 2022-04-03 ENCOUNTER — Other Ambulatory Visit (HOSPITAL_COMMUNITY): Payer: Self-pay

## 2022-04-07 ENCOUNTER — Other Ambulatory Visit (HOSPITAL_COMMUNITY): Payer: Self-pay

## 2022-04-07 ENCOUNTER — Other Ambulatory Visit: Payer: Self-pay | Admitting: Psychiatry

## 2022-04-07 DIAGNOSIS — F319 Bipolar disorder, unspecified: Secondary | ICD-10-CM

## 2022-04-07 MED ORDER — LITHIUM CARBONATE ER 450 MG PO TBCR
450.0000 mg | EXTENDED_RELEASE_TABLET | Freq: Two times a day (BID) | ORAL | 0 refills | Status: DC
  Filled 2022-04-07 – 2022-04-09 (×2): qty 180, 90d supply, fill #0

## 2022-04-07 MED ORDER — QUETIAPINE FUMARATE 50 MG PO TABS
150.0000 mg | ORAL_TABLET | Freq: Every day | ORAL | 0 refills | Status: DC
  Filled 2022-04-07 – 2022-04-09 (×2): qty 90, 30d supply, fill #0

## 2022-04-08 ENCOUNTER — Other Ambulatory Visit (HOSPITAL_COMMUNITY): Payer: Self-pay

## 2022-04-08 ENCOUNTER — Other Ambulatory Visit: Payer: Self-pay

## 2022-04-08 ENCOUNTER — Ambulatory Visit: Payer: 59 | Admitting: Urology

## 2022-04-08 ENCOUNTER — Encounter: Payer: Self-pay | Admitting: Urology

## 2022-04-08 VITALS — BP 156/75 | HR 80

## 2022-04-08 DIAGNOSIS — R351 Nocturia: Secondary | ICD-10-CM

## 2022-04-08 DIAGNOSIS — N400 Enlarged prostate without lower urinary tract symptoms: Secondary | ICD-10-CM

## 2022-04-08 LAB — URINALYSIS, ROUTINE W REFLEX MICROSCOPIC
Bilirubin, UA: NEGATIVE
Glucose, UA: NEGATIVE
Ketones, UA: NEGATIVE
Leukocytes,UA: NEGATIVE
Nitrite, UA: NEGATIVE
Protein,UA: NEGATIVE
RBC, UA: NEGATIVE
Specific Gravity, UA: 1.015 (ref 1.005–1.030)
Urobilinogen, Ur: 0.2 mg/dL (ref 0.2–1.0)
pH, UA: 7.5 (ref 5.0–7.5)

## 2022-04-08 LAB — BLADDER SCAN AMB NON-IMAGING: Scan Result: 143

## 2022-04-08 MED ORDER — SILODOSIN 8 MG PO CAPS
8.0000 mg | ORAL_CAPSULE | Freq: Every evening | ORAL | 11 refills | Status: DC
  Filled 2022-04-08: qty 30, 30d supply, fill #0
  Filled 2022-05-03 – 2022-05-11 (×2): qty 30, 30d supply, fill #1
  Filled 2022-05-27 – 2022-06-05 (×2): qty 30, 30d supply, fill #2
  Filled 2022-07-05: qty 30, 30d supply, fill #3
  Filled 2022-08-04: qty 30, 30d supply, fill #4
  Filled 2022-09-03: qty 30, 30d supply, fill #5
  Filled 2022-09-29: qty 30, 30d supply, fill #6
  Filled 2022-10-28: qty 30, 30d supply, fill #7
  Filled 2022-12-01: qty 30, 30d supply, fill #8
  Filled 2022-12-28: qty 30, 30d supply, fill #9
  Filled 2023-02-03: qty 30, 30d supply, fill #10
  Filled 2023-03-02: qty 30, 30d supply, fill #11

## 2022-04-08 NOTE — Patient Instructions (Signed)

## 2022-04-08 NOTE — Progress Notes (Signed)
04/08/2022 11:18 AM   Corey Reilly [birth year] [id number]  Referring provider: Lujean Amel, MD [place] [address],  [place] [address]  BPH with nocturia   HPI: Corey Reilly is a 61yo here for evaluation of BPH with nocturia. He was last seen 3 years ago. IPSS 22 QOL 4 on uroxatral '10mg'$ . He ran out of the medication and his LUTS worsened. Nocturia 5-7x. Urine stream weaker.   He has straining to urinate. PVR 143cc.                  PMH: Past Medical History:  Diagnosis Date   Bipolar disorder ([place])    Depression    Psoriasis    Ulcer     Surgical History: No past surgical history on file.  Home Medications:  Allergies as of 04/08/2022   No Known Allergies      Medication List        Accurate as of April 08, 2022 11:18 AM. If you have any questions, ask your nurse or doctor.          alfuzosin 10 MG 24 hr tablet Commonly known as: UROXATRAL Take 1 tablet (10 mg total) by mouth 2 (two) times daily.   atorvastatin 20 MG tablet Commonly known as: LIPITOR Take 10 mg by mouth daily.   atorvastatin 20 MG tablet Commonly known as: LIPITOR TAKE 1 TABLET BY MOUTH ONCE A DAY   atorvastatin 20 MG tablet Commonly known as: LIPITOR Take 1 tablet (20 mg total) by mouth daily.   atorvastatin 20 MG tablet Commonly known as: LIPITOR Take 1 tablet (20 mg total) by mouth daily.   Humira Pen 40 MG/0.8ML Pnkt Generic drug: Adalimumab INJECT 40 MG INTO THE SKIN EVERY 14 DAYS.   lisdexamfetamine 50 MG capsule Commonly known as: VYVANSE Take 1 capsule (50 mg total) by mouth every morning   lisdexamfetamine 50 MG capsule Commonly known as: Vyvanse Take 1 capsule (50 mg total) by mouth every morning   lithium carbonate 450 MG ER tablet Commonly known as: ESKALITH Take 1 tablet (450 mg total) by mouth 2 (two) times daily.   lithium carbonate 450 MG ER tablet Commonly known as: ESKALITH Take 1 tablet (450 mg total) by mouth 2 (two) times  daily.   polyethylene glycol-electrolytes 420 g solution Commonly known as: NuLYTELY Use as directed.   QUEtiapine 50 MG tablet Commonly known as: SEROQUEL Take 3 tablets (150 mg total) by mouth at bedtime.   QUEtiapine 50 MG tablet Commonly known as: SEROQUEL Take 3 tablets (150 mg total) by mouth at bedtime.        Allergies: No Known Allergies  Family History: Family History  Problem Relation Age of Onset   Hyperlipidemia Father    COPD Maternal Uncle     Social History:  reports that he has never smoked. He has never used smokeless tobacco. No history on file for alcohol use and drug use.  ROS: All other review of systems were reviewed and are negative except what is noted above in HPI  Physical Exam: BP (!) 156/75   Pulse 80   Constitutional:  Alert and oriented, No acute distress. HEENT: [place] AT, moist mucus membranes.  Trachea midline, no masses. Cardiovascular: No clubbing, cyanosis, or edema. Respiratory: Normal respiratory effort, no increased work of breathing. GI: Abdomen is soft, nontender, nondistended, no abdominal masses GU: No CVA tenderness.  Lymph: No cervical or inguinal lymphadenopathy. Skin: No rashes, bruises or suspicious lesions.  Neurologic: Grossly intact, no focal deficits, moving all 4 extremities. Psychiatric: Normal mood and affect.  Laboratory Data: Lab Results  Component Value Date   WBC 19.2 (H) 05/21/2011   HGB 16.7 05/21/2011   HCT 46.1 05/21/2011   MCV 93.9 05/21/2011   PLT 216 05/21/2011    Lab Results  Component Value Date   CREATININE 1.48 (H) 12/15/2021    No results found for: "PSA"  No results found for: "TESTOSTERONE"  No results found for: "HGBA1C"  Urinalysis    Component Value Date/Time   COLORURINE YELLOW 05/21/2011 [place] 05/21/2011 1807   LABSPEC 1.020 05/21/2011 1807   PHURINE 6.0 05/21/2011 1807   GLUCOSEU NEGATIVE 05/21/2011 1807   HGBUR NEGATIVE 05/21/2011 1807    BILIRUBINUR SMALL (A) 05/21/2011 1807   KETONESUR >80 (A) 05/21/2011 1807   PROTEINUR 30 (A) 05/21/2011 1807   UROBILINOGEN 1.0 05/21/2011 1807   NITRITE NEGATIVE 05/21/2011 1807   LEUKOCYTESUR NEGATIVE 05/21/2011 1807    Lab Results  Component Value Date   BACTERIA RARE 05/21/2011    Pertinent Imaging:  No results found for this or any previous visit.  No results found for this or any previous visit.  No results found for this or any previous visit.  No results found for this or any previous visit.  No results found for this or any previous visit.  No valid procedures specified. No results found for this or any previous visit.  No results found for this or any previous visit.   Assessment & Plan:    1. Benign prostatic hyperplasia, unspecified whether lower urinary tract symptoms present -we will trial rapaflo '8mg'$  qhs - Urinalysis, Routine w reflex microscopic - BLADDER SCAN AMB NON-IMAGING  2. Nocturia -rapaflo '8mg'$  qhs   No follow-ups on file.  Nicolette Bang, MD  [facility] [place]

## 2022-04-09 ENCOUNTER — Other Ambulatory Visit (HOSPITAL_COMMUNITY): Payer: Self-pay

## 2022-04-09 LAB — PSA: Prostate Specific Ag, Serum: 1.5 ng/mL (ref 0.0–4.0)

## 2022-04-10 ENCOUNTER — Other Ambulatory Visit: Payer: Self-pay

## 2022-04-10 ENCOUNTER — Other Ambulatory Visit (HOSPITAL_COMMUNITY): Payer: Self-pay

## 2022-04-13 ENCOUNTER — Other Ambulatory Visit (HOSPITAL_COMMUNITY): Payer: Self-pay

## 2022-04-13 ENCOUNTER — Other Ambulatory Visit: Payer: Self-pay

## 2022-04-15 ENCOUNTER — Other Ambulatory Visit (HOSPITAL_COMMUNITY): Payer: Self-pay

## 2022-04-15 ENCOUNTER — Other Ambulatory Visit: Payer: Self-pay

## 2022-04-17 ENCOUNTER — Other Ambulatory Visit: Payer: Self-pay

## 2022-04-23 ENCOUNTER — Other Ambulatory Visit (HOSPITAL_COMMUNITY): Payer: Self-pay

## 2022-04-23 ENCOUNTER — Other Ambulatory Visit: Payer: Self-pay

## 2022-04-23 DIAGNOSIS — M545 Low back pain, unspecified: Secondary | ICD-10-CM | POA: Diagnosis not present

## 2022-04-23 DIAGNOSIS — E039 Hypothyroidism, unspecified: Secondary | ICD-10-CM | POA: Diagnosis not present

## 2022-04-23 DIAGNOSIS — N401 Enlarged prostate with lower urinary tract symptoms: Secondary | ICD-10-CM | POA: Diagnosis not present

## 2022-04-23 DIAGNOSIS — Z0000 Encounter for general adult medical examination without abnormal findings: Secondary | ICD-10-CM | POA: Diagnosis not present

## 2022-04-23 DIAGNOSIS — E7800 Pure hypercholesterolemia, unspecified: Secondary | ICD-10-CM | POA: Diagnosis not present

## 2022-04-23 DIAGNOSIS — F321 Major depressive disorder, single episode, moderate: Secondary | ICD-10-CM | POA: Diagnosis not present

## 2022-04-23 DIAGNOSIS — Z131 Encounter for screening for diabetes mellitus: Secondary | ICD-10-CM | POA: Diagnosis not present

## 2022-04-23 NOTE — Progress Notes (Signed)
Sent via mychart

## 2022-04-24 ENCOUNTER — Other Ambulatory Visit (HOSPITAL_COMMUNITY): Payer: Self-pay

## 2022-04-24 ENCOUNTER — Other Ambulatory Visit: Payer: Self-pay

## 2022-04-28 ENCOUNTER — Other Ambulatory Visit (HOSPITAL_COMMUNITY): Payer: Self-pay

## 2022-04-28 ENCOUNTER — Other Ambulatory Visit: Payer: Self-pay

## 2022-04-29 ENCOUNTER — Other Ambulatory Visit (HOSPITAL_COMMUNITY): Payer: Self-pay

## 2022-05-01 ENCOUNTER — Other Ambulatory Visit: Payer: Self-pay

## 2022-05-04 ENCOUNTER — Other Ambulatory Visit: Payer: Self-pay

## 2022-05-04 ENCOUNTER — Encounter (HOSPITAL_COMMUNITY): Payer: Self-pay

## 2022-05-04 ENCOUNTER — Other Ambulatory Visit (HOSPITAL_COMMUNITY): Payer: Self-pay

## 2022-05-08 ENCOUNTER — Other Ambulatory Visit (HOSPITAL_COMMUNITY): Payer: Self-pay

## 2022-05-08 ENCOUNTER — Other Ambulatory Visit: Payer: Self-pay

## 2022-05-11 ENCOUNTER — Other Ambulatory Visit (HOSPITAL_COMMUNITY): Payer: Self-pay

## 2022-05-12 ENCOUNTER — Other Ambulatory Visit (HOSPITAL_COMMUNITY): Payer: Self-pay

## 2022-05-12 ENCOUNTER — Encounter: Payer: Self-pay | Admitting: Psychiatry

## 2022-05-12 ENCOUNTER — Ambulatory Visit (INDEPENDENT_AMBULATORY_CARE_PROVIDER_SITE_OTHER): Payer: Commercial Managed Care - PPO | Admitting: Psychiatry

## 2022-05-12 VITALS — BP 155/92 | HR 78

## 2022-05-12 DIAGNOSIS — F3110 Bipolar disorder, current episode manic without psychotic features, unspecified: Secondary | ICD-10-CM | POA: Diagnosis not present

## 2022-05-12 DIAGNOSIS — Z79899 Other long term (current) drug therapy: Secondary | ICD-10-CM | POA: Diagnosis not present

## 2022-05-12 DIAGNOSIS — F902 Attention-deficit hyperactivity disorder, combined type: Secondary | ICD-10-CM

## 2022-05-12 MED ORDER — LISDEXAMFETAMINE DIMESYLATE 40 MG PO CAPS
40.0000 mg | ORAL_CAPSULE | Freq: Every day | ORAL | 0 refills | Status: DC
  Filled 2022-05-12 – 2022-07-05 (×5): qty 90, 90d supply, fill #0

## 2022-05-12 NOTE — Progress Notes (Signed)
Corey Reilly [id number] 12-25-1960 62 y.o.  Subjective:   Patient ID:  Corey Reilly is a 62 y.o. (DOB [birth year]) male.  Chief Complaint:  Chief Complaint  Patient presents with   Follow-up    Bipolar I disorder, most recent episode (or current) manic ([place])   ADHD    Depression        Associated symptoms include no decreased concentration and no suicidal ideas.  Corey Reilly presents to the office today for follow-up of bipolar disorder and concerns about focus and possible ADD.  When seen December 2019.  No meds were changed.  Labs were received with low normal lithium level.  Creatinine was slightly elevated at 1.4.  When seen in June.  No meds were changed.  Was recommended that he try NAC 600 mg daily for cognition.  Completed ADHD self questionnaire which was high on both the inattention and hyperactivity scales.  Tried NAC 1000 mg with less memory issues but not dramatic.  Does think it's improved but not enough and wanted to pursue a trial of stimulants.  This was discussed with his wife who noted the following: Periods of poor function intermittently.  Gets distracted and then forgets.  Cannot control it.  Wonders if it's ADD.  D dx ADHD and the 2 of them are similar and she is on Adderall.  At [employer].  Constantly distracted.  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  Therefore at the visit in August we initiated a trial of Vyvanse 30 mg daily. He called back September 3 asking for refills stating it was working well.  He did increase Vyvanse to 40 mg daily.  seen February 06, 2019.  Rec trial NAC 1000 mg for STM and continue Vyvanse 30 as best tolerated dose.  No other med changes.  Overall doing OK.  Expected stress with Covid and politics.  Mental health is OK. Doesn't feel the Vyvanse in any kick or mood but focus is definitely better.  Last month is best month ever and much mor productive and efficient.  Lost a little weight on it.  SE low appetite so  skips on the weekend.  Less distractible with Vyvanse and only uses work days.    seen May 09, 2019.  No meds were changed.  08/07/19 appt with the following noted: He increased the lithium dose back to 900 mg daily about first of April, because he was having some manic and depressive symptoms.  Lithium level requested was 0.6 at lower dosage of 675 mg daily.. Mood the last few days is OK.  Dealing with some pain in TMJ that is affecting him. 2 nights of no sleep but otherwise normal sleep. Has used more caffeine than usual but only in the morning.    Patient reports stable mood and denies depressed or irritable moods.  Patient denies any recent difficulty with anxiety.  Patient denies difficulty with sleep initiation or maintenance. Denies appetite disturbance.  Patient reports that energy and motivation have been good.  Patient denies any difficulty with concentration.  Patient denies any suicidal ideation. Plan: He is not manic and no longer depressed after increase lithium to 900 mg daily.  stable even with Vyvanse and doing well with lithium  12/06/19 appt with the following noted: All in all well.  Not sign manic.  Stayed on lithium 900.  Sleep good on 50 mg daily 7-8 hours.  No SE except  Not hungry on Vyvanse.  05/08/2020 appt noted: Mood OK  overall.  But got worked up too much last week for about 3 hours but it only lasted those hours. Some problems with sleep without apparent reason though occ forgets it. Sometimes a night with no sleep 1-2 times a month.  Usually good sleep routine. Melatonin and benadryl  Health is good. Plan: Get labs as soon as possible  06/14/2020 phone call with patient:  Patient called because of emerging mania. MD response: Return telephone call to patient: Had information indirectly from his wife that the patient is manic. Patient reports only 1 hour of sleep last night, impulsive somewhat reckless decision making with poor concentration.  He feels revved  up even and is up in his body at night.  He has been compliant with medications. No psychotic symptoms and no suicidal thoughts or homicidal thoughts. Discussed that the patient is having manic symptoms that are breaking through despite adequate lithium level of 1.0 just a few days ago.  Therefore going up and lithium is not a good option.  He is taking quetiapine 50 mg nightly.  Discussed the pros and cons of increasing quetiapine to manage this acute mania versus adding Depakote which is the other more common mood stabilizer.  It is likely that increasing quetiapine will work quicker because he will start sleeping better almost right away probably.  Discussed the dosing range of Seroquel for mania is 300 to 800 mg nightly.  Discussed the side effects of Seroquel. Increase Seroquel to 300 mg nightly and continue lithium. Schedule follow-up soon We will discussed the option of transitioning him to Depakote as an alternative to the lithium at his appointment.  07/11/2020 appointment with the following noted:  Did the job with mood stab ilizing.  But was pretty hungover and reduced the dose to 150 mg HS and it seems fine.  Denies any manic sx and doesn't think wife sees him manic now.  Sleep is fine about 7-8 hours.   SE a bit harder getting up and going int he morning.  No clear triggers. Wife Dyann Ruddle thought his manic sx lasted about 4 weeks with volatility and difficulty functioning with work.  Hyperverbal and overinclusive and hypergraphia.  Noticed by others but resolved.  Couldn't stop himself.  Didn't really recognize mania until pointed out to him. Plan: OK increase Vyvanse 50 mg daily.   11/05/2020 appointment with the following noted: Doing well overall home and work and life fine.  Vyvanse helpful.   Continue meds.  Tried microdosing mushrooms over 6 weeks and it seemed to help.  Helped him be a better person.   Plan: Needed increase Seroquel to 300 mg HS for recent mania not prevented by  lithium .  But he says it's too sedating so dropped to 150.  . not depressed after increase lithium to 900 mg daily.   Continue Vyvanse 50 mg every morning  05/08/2021 appointment with the following noted: Never better as an adult. Taking low dose Seroquel 75 mg HS.  Sleeping well for the most part. No SE with meds. Satisfied with meds.  Benefit. Health has been good. Plan: For convenience we will reduce Seroquel to 50 mg nightly.  And also for tolerability. . not depressed after increase lithium to 900 mg daily.   Continue Vyvanse 50 mg every morning Check lithium labs  11/06/2021 appointment with the following noted: Doing well.  Business growing fast.  Hanging in there well.  No mood swings nor depression. Sleeping good with quetiapine 150 mg HS Continues lithium  900 and Vyvanse 50 Plan: No med changes: Continue Vyvanse 50 mg every morning Continue lithium 900 mg daily Continue quetiapine 150 mg nightly for treatment complicating insomnia and bipolar disorder Check lithium level  05/12/2022 appointment noted: Meds as above with less quetiapine 100 mg HS with no hangover. Mood is good , happy and stable. Had work stress and insomnia early in month but resolved. Vyvanse working well. Still some OCD tendencies.  A lot of time in spreadsheets and perfectionistic about things.  Perfectionistic.   Socks rolled in certain way and all arranged.  Patient reports stable mood and denies depressed or irritable moods.  Patient denies any recent difficulty with anxiety.  Patient denies difficulty with sleep initiation or maintenance. Denies appetite disturbance.  Patient reports that energy and motivation have been good.  Patient denies any difficulty with concentration.  Patient denies any suicidal ideation.  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 D doesn't drink bc pt has hx of alcohol dependence.  He's been sober for many years.  Attention problems.  Talked to  wife.    Past psychiatric medication trials include Geodon which caused sedation, Zyprexa which caused sedation, Latuda which caused akathisia and Seroquel Lithium excellent response at 900 mg daily Vyvanse Found out Aunt has bipolar disabled.  Review of Systems:  Review of Systems  Respiratory:  Negative for shortness of breath.   Cardiovascular:  Negative for chest pain and palpitations.  Skin:  Positive for rash.  Neurological:  Negative for dizziness, tremors, weakness and light-headedness.  Psychiatric/Behavioral:  Negative for agitation, behavioral problems, confusion, decreased concentration, dysphoric mood, hallucinations, self-injury, sleep disturbance and suicidal ideas. The patient is not nervous/anxious and is not hyperactive.     Medications: I have reviewed the patient's current medications.  Current Outpatient Medications  Medication Sig Dispense Refill   alfuzosin (UROXATRAL) 10 MG 24 hr tablet Take 1 tablet (10 mg total) by mouth 2 (two) times daily. 180 tablet 0   atorvastatin (LIPITOR) 20 MG tablet Take 1 tablet (20 mg total) by mouth daily. 90 tablet 3   atorvastatin (LIPITOR) 20 MG tablet Take 1 tablet (20 mg total) by mouth daily. 90 tablet 4   lisdexamfetamine (VYVANSE) 50 MG capsule Take 1 capsule (50 mg total) by mouth every morning 90 capsule 0   lithium carbonate (ESKALITH) 450 MG ER tablet Take 1 tablet (450 mg total) by mouth 2 (two) times daily. 180 tablet 0   QUEtiapine (SEROQUEL) 50 MG tablet Take 3 tablets (150 mg total) by mouth at bedtime. (Patient taking differently: Take 150 mg by mouth at bedtime. 2 tabs HS) 90 tablet 0   silodosin (RAPAFLO) 8 MG CAPS capsule Take 1 capsule (8 mg total) by mouth at bedtime. 30 capsule 11   Adalimumab 40 MG/0.8ML PNKT INJECT 40 MG INTO THE SKIN EVERY 14 DAYS. 2 each 8   lisdexamfetamine (VYVANSE) 40 MG capsule Take 1 capsule (40 mg total) by mouth daily. 90 capsule 0   No current facility-administered medications for  this visit.    Medication Side Effects: None  Allergies: No Known Allergies  Past Medical History:  Diagnosis Date   Bipolar disorder ([place])    Depression    Psoriasis    Ulcer     Family History  Problem Relation Age of Onset   Hyperlipidemia Father    COPD Maternal Uncle     Social History   Socioeconomic History   Marital status: Married  Spouse name: Not on file   Number of children: Not on file   Years of education: Not on file   Highest education level: Not on file  Occupational History   Not on file  Tobacco Use   Smoking status: Never   Smokeless tobacco: Never  Substance and Sexual Activity   Alcohol use: Not on file   Drug use: Not on file   Sexual activity: Not on file  Other Topics Concern   Not on file  Social History Narrative   Not on file   Social Determinants of Health   Financial Resource Strain: Not on file  Food Insecurity: Not on file  Transportation Needs: Not on file  Physical Activity: Not on file  Stress: Not on file  Social Connections: Not on file  Intimate Partner Violence: Not on file    Past Medical History, Surgical history, Social history, and Family history were reviewed and updated as appropriate.   Please see review of systems for further details on the patient's review from today.   Objective:   Physical Exam:  BP (!) 155/92   Pulse 78   Physical Exam Constitutional:      General: He is not in acute distress. Musculoskeletal:        General: No deformity.  Neurological:     Mental Status: He is alert and oriented to person, place, and time.     Cranial Nerves: No dysarthria.     Coordination: Coordination normal.  Psychiatric:        Attention and Perception: Attention and perception normal. He does not perceive auditory or visual hallucinations.        Mood and Affect: Mood normal. Mood is not anxious, depressed or elated. Affect is not labile or angry.        Speech: Speech normal. Speech is not rapid  and pressured or slurred.        Behavior: Behavior normal. Behavior is not agitated. Behavior is cooperative.        Thought Content: Thought content normal. Thought content is not delusional. Thought content does not include homicidal or suicidal ideation. Thought content does not include suicidal plan.        Cognition and Memory: Cognition and memory normal.        Judgment: Judgment normal.     Comments: Insight intact    Lab Review:     Component Value Date/Time   NA 141 12/15/2021 1624   K 4.1 12/15/2021 1624   CL 103 12/15/2021 1624   CO2 27 12/15/2021 1624   GLUCOSE 89 12/15/2021 1624   BUN 8 12/15/2021 1624   CREATININE 1.48 (H) 12/15/2021 1624   CALCIUM 9.8 12/15/2021 1624   PROT 8.3 05/21/2011 1753   ALBUMIN 4.4 05/21/2011 1753   AST 48 (H) 05/21/2011 1753   ALT 37 05/21/2011 1753   ALKPHOS 87 05/21/2011 1753   BILITOT 2.5 (H) 05/21/2011 1753   GFRNONAA 79 (L) 05/21/2011 1753   GFRAA >90 05/21/2011 1753       Component Value Date/Time   WBC 19.2 (H) 05/21/2011 1753   RBC 4.91 05/21/2011 1753   HGB 16.7 05/21/2011 1753   HCT 46.1 05/21/2011 1753   PLT 216 05/21/2011 1753   MCV 93.9 05/21/2011 1753   MCH 34.0 05/21/2011 1753   MCHC 36.2 (H) 05/21/2011 1753   RDW 13.0 05/21/2011 1753   LYMPHSABS 0.8 05/21/2011 1753   MONOABS 1.2 (H) 05/21/2011 1753   EOSABS 0.0 05/21/2011 1753  BASOSABS 0.0 05/21/2011 1753   lithium level October 21, 2017 0.4.  This was on 675 mg a day same his current dosage.  His level in November 18 was 1.10 900 mg a day.  The dosage was reduced because of elevated creatinine which in June was 1.53. Lithium Lvl  Date Value Ref Range Status  12/15/2021 1.0 0.6 - 1.2 mmol/L Final   This was on 900 mg lithium daily.  THS 2.54 12/23 Dr.K Cr 1.43 12/23 Pending FU PCP 11/15/21   No results found for: "PHENYTOIN", "PHENOBARB", "VALPROATE", "CBMZ"   Adult self-report scale for ADD score was 29 on inattentive side which is high and 23 on the  hyperactive scale which is borderline high suggestive of ADHD  .res Assessment: Plan:    Westen was seen today for follow-up and adhd.  Diagnoses and all orders for this visit:  Bipolar I disorder, most recent episode (or current) manic ([place])  Attention deficit hyperactivity disorder (ADHD), combined type -     lisdexamfetamine (VYVANSE) 40 MG capsule; Take 1 capsule (40 mg total) by mouth daily.  Lithium use   Greater than 50% of 30 min face to face time with patient was spent on counseling and coordination of care. We discussed Bipolar disorder with a history of marked positive response to lithium CR creep    Hx borderline creatinine. Counseled patient regarding potential benefits, risks, and side effects of lithium to include potential risk of lithium affecting thyroid and renal function.  Discussed need for periodic lab monitoring to determine drug level and to assess for potential adverse effects.  Counseled patient regarding signs and symptoms of lithium toxicity and advised that they notify office immediately or seek urgent medical attention if experiencing these signs and symptoms.  Patient advised to contact office with any questions or concerns.  12/15/2021 lithium level 1.0 on 900 mg daily, creatinine 1.48, calcium 9.8 04/08/2022 urinalysis unremarkable  Needed increase Seroquel to 300 mg HS for recent mania not prevented by lithium in the past but has been stable on just 75 mg nightly as needed for sleep over the last several months..  He was too sedated on Seroquel 300 mg nightly. Continue Seroquel 150 mg nightly.  And also for tolerability. . not depressed after increase lithium to 900 mg daily.   Overall reports that during this last year his mood has been the best it has ever been.  May be hyperfocused on Vyvanse.  Disc at length difference between this and OC issues.  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 Productivity better and better on task  with Vyvanse  PDMP is clear Disc shortage of vyvanse and dosing.  Call if any recurrence of mania.  He feels capable of recognizing the symptoms.  No med changes: Reduce Vyvanse 40 mg every morning bc question overdriven and perfectionism with spreadsheets etc Continue lithium 900 mg daily Continue quetiapine 150 mg nightly for treatment complicating insomnia and bipolar disorder  FU  6 mos   Lynder Parents, MD, DFAPA   Please see After Visit Summary for patient specific instructions.  Future Appointments  Date Time Provider [place]  04/14/2023 10:10 AM McKenzie, Candee Furbish, MD [facility] None     No orders of the defined types were placed in this encounter.     -------------------------------

## 2022-05-27 ENCOUNTER — Other Ambulatory Visit (HOSPITAL_COMMUNITY): Payer: Self-pay

## 2022-05-27 ENCOUNTER — Other Ambulatory Visit: Payer: Self-pay

## 2022-05-27 ENCOUNTER — Encounter (HOSPITAL_COMMUNITY): Payer: Self-pay

## 2022-05-30 ENCOUNTER — Encounter: Payer: Self-pay | Admitting: Urology

## 2022-06-05 ENCOUNTER — Other Ambulatory Visit: Payer: Self-pay

## 2022-06-05 ENCOUNTER — Other Ambulatory Visit (HOSPITAL_COMMUNITY): Payer: Self-pay

## 2022-06-08 ENCOUNTER — Other Ambulatory Visit: Payer: Self-pay

## 2022-06-22 ENCOUNTER — Other Ambulatory Visit: Payer: Self-pay | Admitting: Psychiatry

## 2022-06-22 DIAGNOSIS — F319 Bipolar disorder, unspecified: Secondary | ICD-10-CM

## 2022-06-22 DIAGNOSIS — F902 Attention-deficit hyperactivity disorder, combined type: Secondary | ICD-10-CM

## 2022-06-22 MED ORDER — LISDEXAMFETAMINE DIMESYLATE 50 MG PO CAPS
50.0000 mg | ORAL_CAPSULE | Freq: Every day | ORAL | 0 refills | Status: DC
  Filled 2022-06-22 – 2022-06-24 (×2): qty 90, 90d supply, fill #0

## 2022-06-22 MED ORDER — QUETIAPINE FUMARATE 50 MG PO TABS
150.0000 mg | ORAL_TABLET | Freq: Every day | ORAL | 0 refills | Status: DC
  Filled 2022-06-22: qty 90, 30d supply, fill #0

## 2022-06-23 ENCOUNTER — Other Ambulatory Visit: Payer: Self-pay

## 2022-06-23 ENCOUNTER — Other Ambulatory Visit (HOSPITAL_COMMUNITY): Payer: Self-pay

## 2022-06-24 ENCOUNTER — Other Ambulatory Visit (HOSPITAL_COMMUNITY): Payer: Self-pay

## 2022-06-25 ENCOUNTER — Other Ambulatory Visit (HOSPITAL_COMMUNITY): Payer: Self-pay

## 2022-07-05 ENCOUNTER — Other Ambulatory Visit: Payer: Self-pay | Admitting: Internal Medicine

## 2022-07-05 DIAGNOSIS — L400 Psoriasis vulgaris: Secondary | ICD-10-CM

## 2022-07-05 DIAGNOSIS — L409 Psoriasis, unspecified: Secondary | ICD-10-CM

## 2022-07-06 ENCOUNTER — Other Ambulatory Visit (HOSPITAL_COMMUNITY): Payer: Self-pay

## 2022-07-06 ENCOUNTER — Other Ambulatory Visit: Payer: Self-pay | Admitting: Psychiatry

## 2022-07-06 ENCOUNTER — Other Ambulatory Visit: Payer: Self-pay

## 2022-07-06 MED ORDER — LITHIUM CARBONATE ER 450 MG PO TBCR
450.0000 mg | EXTENDED_RELEASE_TABLET | Freq: Two times a day (BID) | ORAL | 0 refills | Status: DC
  Filled 2022-07-06: qty 180, 90d supply, fill #0

## 2022-07-07 ENCOUNTER — Other Ambulatory Visit (HOSPITAL_COMMUNITY): Payer: Self-pay

## 2022-07-07 ENCOUNTER — Other Ambulatory Visit: Payer: Self-pay

## 2022-07-08 ENCOUNTER — Other Ambulatory Visit (HOSPITAL_COMMUNITY): Payer: Self-pay

## 2022-07-09 ENCOUNTER — Other Ambulatory Visit (HOSPITAL_COMMUNITY): Payer: Self-pay

## 2022-07-09 ENCOUNTER — Other Ambulatory Visit: Payer: Self-pay

## 2022-07-23 ENCOUNTER — Other Ambulatory Visit (HOSPITAL_COMMUNITY): Payer: Self-pay

## 2022-07-31 ENCOUNTER — Other Ambulatory Visit (HOSPITAL_COMMUNITY): Payer: Self-pay

## 2022-08-04 ENCOUNTER — Other Ambulatory Visit: Payer: Self-pay | Admitting: Psychiatry

## 2022-08-04 ENCOUNTER — Other Ambulatory Visit (HOSPITAL_COMMUNITY): Payer: Self-pay

## 2022-08-04 ENCOUNTER — Other Ambulatory Visit: Payer: Self-pay

## 2022-08-04 DIAGNOSIS — F319 Bipolar disorder, unspecified: Secondary | ICD-10-CM

## 2022-08-05 MED ORDER — QUETIAPINE FUMARATE 50 MG PO TABS
150.0000 mg | ORAL_TABLET | Freq: Every day | ORAL | 0 refills | Status: DC
  Filled 2022-08-05: qty 90, 30d supply, fill #0

## 2022-08-06 ENCOUNTER — Other Ambulatory Visit (HOSPITAL_COMMUNITY): Payer: Self-pay

## 2022-08-06 ENCOUNTER — Other Ambulatory Visit: Payer: Self-pay

## 2022-08-29 ENCOUNTER — Other Ambulatory Visit: Payer: Self-pay | Admitting: Internal Medicine

## 2022-08-29 DIAGNOSIS — L409 Psoriasis, unspecified: Secondary | ICD-10-CM

## 2022-08-29 DIAGNOSIS — L400 Psoriasis vulgaris: Secondary | ICD-10-CM

## 2022-09-03 ENCOUNTER — Other Ambulatory Visit (HOSPITAL_COMMUNITY): Payer: Self-pay

## 2022-09-25 ENCOUNTER — Other Ambulatory Visit (HOSPITAL_COMMUNITY): Payer: Self-pay

## 2022-09-29 ENCOUNTER — Other Ambulatory Visit: Payer: Self-pay | Admitting: Psychiatry

## 2022-09-29 ENCOUNTER — Other Ambulatory Visit: Payer: Self-pay

## 2022-09-29 ENCOUNTER — Other Ambulatory Visit (HOSPITAL_COMMUNITY): Payer: Self-pay

## 2022-09-29 DIAGNOSIS — F902 Attention-deficit hyperactivity disorder, combined type: Secondary | ICD-10-CM

## 2022-09-29 MED ORDER — LITHIUM CARBONATE ER 450 MG PO TBCR
450.0000 mg | EXTENDED_RELEASE_TABLET | Freq: Two times a day (BID) | ORAL | 0 refills | Status: DC
  Filled 2022-09-29: qty 120, 60d supply, fill #0

## 2022-09-29 NOTE — Telephone Encounter (Signed)
Due 6/10

## 2022-09-30 ENCOUNTER — Other Ambulatory Visit: Payer: Self-pay

## 2022-09-30 ENCOUNTER — Other Ambulatory Visit (HOSPITAL_COMMUNITY): Payer: Self-pay

## 2022-09-30 MED ORDER — LISDEXAMFETAMINE DIMESYLATE 50 MG PO CAPS
50.0000 mg | ORAL_CAPSULE | Freq: Every day | ORAL | 0 refills | Status: DC
  Filled 2022-09-30 – 2022-10-28 (×2): qty 90, 90d supply, fill #0

## 2022-10-01 ENCOUNTER — Other Ambulatory Visit (HOSPITAL_COMMUNITY): Payer: Self-pay

## 2022-10-08 ENCOUNTER — Other Ambulatory Visit (HOSPITAL_COMMUNITY): Payer: Self-pay

## 2022-10-08 ENCOUNTER — Other Ambulatory Visit: Payer: Self-pay | Admitting: Psychiatry

## 2022-10-08 DIAGNOSIS — F319 Bipolar disorder, unspecified: Secondary | ICD-10-CM

## 2022-10-08 MED ORDER — QUETIAPINE FUMARATE 50 MG PO TABS
150.0000 mg | ORAL_TABLET | Freq: Every day | ORAL | 1 refills | Status: DC
  Filled 2022-10-08: qty 90, 30d supply, fill #0
  Filled 2022-11-23: qty 90, 30d supply, fill #1

## 2022-10-28 ENCOUNTER — Other Ambulatory Visit (HOSPITAL_COMMUNITY): Payer: Self-pay

## 2022-10-28 ENCOUNTER — Other Ambulatory Visit: Payer: Self-pay

## 2022-10-30 ENCOUNTER — Other Ambulatory Visit: Payer: Self-pay

## 2022-11-02 ENCOUNTER — Other Ambulatory Visit (HOSPITAL_COMMUNITY): Payer: Self-pay

## 2022-11-02 ENCOUNTER — Other Ambulatory Visit: Payer: Self-pay

## 2022-11-02 DIAGNOSIS — D2262 Melanocytic nevi of left upper limb, including shoulder: Secondary | ICD-10-CM | POA: Diagnosis not present

## 2022-11-02 DIAGNOSIS — Z79899 Other long term (current) drug therapy: Secondary | ICD-10-CM | POA: Diagnosis not present

## 2022-11-02 DIAGNOSIS — L400 Psoriasis vulgaris: Secondary | ICD-10-CM | POA: Diagnosis not present

## 2022-11-02 DIAGNOSIS — Z85828 Personal history of other malignant neoplasm of skin: Secondary | ICD-10-CM | POA: Diagnosis not present

## 2022-11-02 MED ORDER — HUMIRA (2 PEN) 40 MG/0.8ML ~~LOC~~ AJKT: AUTO-INJECTOR | SUBCUTANEOUS | 5 refills | Status: DC

## 2022-11-03 ENCOUNTER — Other Ambulatory Visit: Payer: Self-pay

## 2022-11-04 ENCOUNTER — Ambulatory Visit: Payer: Self-pay | Attending: Family Medicine | Admitting: Pharmacist

## 2022-11-04 ENCOUNTER — Other Ambulatory Visit: Payer: Self-pay

## 2022-11-04 ENCOUNTER — Other Ambulatory Visit (HOSPITAL_COMMUNITY): Payer: Self-pay

## 2022-11-04 DIAGNOSIS — Z79899 Other long term (current) drug therapy: Secondary | ICD-10-CM

## 2022-11-04 MED ORDER — HUMIRA (2 PEN) 40 MG/0.8ML ~~LOC~~ AJKT
AUTO-INJECTOR | SUBCUTANEOUS | 5 refills | Status: DC
  Filled 2022-11-04: qty 2, fill #0
  Filled 2022-11-04: qty 2, 28d supply, fill #0
  Filled 2023-06-12 – 2023-07-01 (×2): qty 2, 28d supply, fill #1

## 2022-11-04 NOTE — Progress Notes (Signed)
.  S: Patient presents today to the [facility].  Patient is currently taking Humira for psoriasis. Patient is managed by Dr. Leonie Man for this.   Efficacy: still continues to work well.  Adherence: confirmed   Dosing:  Plaque psoriasis: SubQ: Maintenance: 40 mg every other week   Drug-drug interactions: none  Screening: TB test: completed per patient Hepatitis: completed  Monitoring: S/sx of infection: denies CBC: WNL per patient S/sx of hypersensitivity: denies S/sx of malignancy: denies S/sx of heart failure: denies  O:     Lab Results  Component Value Date   WBC 19.2 (H) 05/21/2011   HGB 16.7 05/21/2011   HCT 46.1 05/21/2011   MCV 93.9 05/21/2011   PLT 216 05/21/2011      Chemistry      Component Value Date/Time   NA 141 12/15/2021 1624   K 4.1 12/15/2021 1624   CL 103 12/15/2021 1624   CO2 27 12/15/2021 1624   BUN 8 12/15/2021 1624   CREATININE 1.48 (H) 12/15/2021 1624      Component Value Date/Time   CALCIUM 9.8 12/15/2021 1624   ALKPHOS 87 05/21/2011 1753   AST 48 (H) 05/21/2011 1753   ALT 37 05/21/2011 1753   BILITOT 2.5 (H) 05/21/2011 1753      A/P: 1. Medication review: Patient on Humira for psoriasis and continues to do well on it. Reviewed the medication with the patient, including the following: Humira is a TNF blocking agent indicated for ankylosing spondylitis, Crohn's disease, Hidradenitis suppurativa, psoriatic arthritis, plaque psoriasis, ulcerative colitis, and uveitis. The most common adverse effects are infections, headache, and injection site reactions. There is the possibility of an increased risk of malignancy but it is not well understood if this increased risk is due to there medication or the disease state. There are rare cases of pancytopenia and aplastic anemia. No recommendations for changes.   Butch Penny, PharmD, [place]y [place], CPP Clinical Pharmacist [facility]  [facility] [phone]

## 2022-11-10 ENCOUNTER — Other Ambulatory Visit: Payer: Self-pay

## 2022-11-10 ENCOUNTER — Ambulatory Visit: Payer: Commercial Managed Care - PPO | Admitting: Psychiatry

## 2022-11-10 NOTE — Progress Notes (Signed)
NS  NC 

## 2022-11-23 ENCOUNTER — Other Ambulatory Visit: Payer: Self-pay | Admitting: Psychiatry

## 2022-11-23 MED ORDER — LITHIUM CARBONATE ER 450 MG PO TBCR
450.0000 mg | EXTENDED_RELEASE_TABLET | Freq: Two times a day (BID) | ORAL | 0 refills | Status: DC
  Filled 2022-11-23: qty 60, 30d supply, fill #0

## 2022-11-24 ENCOUNTER — Other Ambulatory Visit (HOSPITAL_COMMUNITY): Payer: Self-pay

## 2022-11-26 ENCOUNTER — Other Ambulatory Visit: Payer: Self-pay

## 2022-11-30 ENCOUNTER — Other Ambulatory Visit (HOSPITAL_COMMUNITY): Payer: Self-pay

## 2022-11-30 ENCOUNTER — Encounter (HOSPITAL_COMMUNITY): Payer: Self-pay

## 2022-12-01 ENCOUNTER — Other Ambulatory Visit (HOSPITAL_COMMUNITY): Payer: Self-pay

## 2022-12-09 ENCOUNTER — Other Ambulatory Visit (HOSPITAL_COMMUNITY): Payer: Self-pay

## 2022-12-16 ENCOUNTER — Telehealth (INDEPENDENT_AMBULATORY_CARE_PROVIDER_SITE_OTHER): Payer: 59 | Admitting: Psychiatry

## 2022-12-16 ENCOUNTER — Encounter: Payer: Self-pay | Admitting: Psychiatry

## 2022-12-16 ENCOUNTER — Other Ambulatory Visit (HOSPITAL_COMMUNITY): Payer: Self-pay

## 2022-12-16 DIAGNOSIS — F3110 Bipolar disorder, current episode manic without psychotic features, unspecified: Secondary | ICD-10-CM | POA: Diagnosis not present

## 2022-12-16 DIAGNOSIS — F902 Attention-deficit hyperactivity disorder, combined type: Secondary | ICD-10-CM

## 2022-12-16 DIAGNOSIS — Z79899 Other long term (current) drug therapy: Secondary | ICD-10-CM

## 2022-12-16 MED ORDER — LISDEXAMFETAMINE DIMESYLATE 40 MG PO CAPS
40.0000 mg | ORAL_CAPSULE | Freq: Every day | ORAL | 0 refills | Status: DC
Start: 2022-12-16 — End: 2023-04-08
  Filled 2022-12-16: qty 90, 90d supply, fill #0

## 2022-12-16 MED ORDER — LITHIUM CARBONATE ER 450 MG PO TBCR
450.0000 mg | EXTENDED_RELEASE_TABLET | Freq: Two times a day (BID) | ORAL | 1 refills | Status: DC
Start: 2022-12-16 — End: 2023-07-01
  Filled 2022-12-16 – 2022-12-20 (×2): qty 180, 90d supply, fill #0
  Filled 2023-04-08: qty 180, 90d supply, fill #1

## 2022-12-16 MED ORDER — QUETIAPINE FUMARATE 50 MG PO TABS
150.0000 mg | ORAL_TABLET | Freq: Every day | ORAL | 1 refills | Status: DC
  Filled 2022-12-16 – 2022-12-20 (×2): qty 270, 90d supply, fill #0
  Filled 2023-04-21: qty 270, 90d supply, fill #1

## 2022-12-16 NOTE — Progress Notes (Signed)
Corey Reilly [id number] September 26, 1960 62 y.o.  Video Visit via My Chart  I connected with pt by video using My Chart and verified that I am speaking with the correct person using two identifiers.   I discussed the limitations, risks, security and privacy concerns of performing an evaluation and management service by My Chart  and the availability of in person appointments. I also discussed with the patient that there may be a patient responsible charge related to this service. The patient expressed understanding and agreed to proceed.  I discussed the assessment and treatment plan with the patient. The patient was provided an opportunity to ask questions and all were answered. The patient agreed with the plan and demonstrated an understanding of the instructions.   The patient was advised to call back or seek an in-person evaluation if the symptoms worsen or if the condition fails to improve as anticipated.  I provided 30 minutes of video time during this encounter.  The patient was located at home and the provider was located office. Session [id number]  Subjective:   Patient ID:  Corey Reilly is a 62 y.o. (DOB 1960-10-26) male.  Chief Complaint:  Chief Complaint  Patient presents with   Follow-up    Depression        Associated symptoms include no decreased concentration and no suicidal ideas.  Corey Reilly presents to the office today for follow-up of bipolar disorder and concerns about focus and possible ADD.  When seen December 2019.  No meds were changed.  Labs were received with low normal lithium level.  Creatinine was slightly elevated at 1.4.  When seen in June.  No meds were changed.  Was recommended that he try NAC 600 mg daily for cognition.  Completed ADHD self questionnaire which was high on both the inattention and hyperactivity scales.  Tried NAC 1000 mg with less memory issues but not dramatic.  Does think it's improved but not enough and wanted to pursue a  trial of stimulants.  This was discussed with his wife who noted the following: Periods of poor function intermittently.  Gets distracted and then forgets.  Cannot control it.  Wonders if it's ADD.  D dx ADHD and the 2 of them are similar and she is on Adderall.  At [employer].  Constantly distracted.  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  Therefore at the visit in August we initiated a trial of Vyvanse 30 mg daily. He called back September 3 asking for refills stating it was working well.  He did increase Vyvanse to 40 mg daily.  seen February 06, 2019.  Rec trial NAC 1000 mg for STM and continue Vyvanse 30 as best tolerated dose.  No other med changes.  Overall doing OK.  Expected stress with Covid and politics.  Mental health is OK. Doesn't feel the Vyvanse in any kick or mood but focus is definitely better.  Last month is best month ever and much mor productive and efficient.  Lost a little weight on it.  SE low appetite so skips on the weekend.  Less distractible with Vyvanse and only uses work days.    seen May 09, 2019.  No meds were changed.  08/07/19 appt with the following noted: He increased the lithium dose back to 900 mg daily about first of April, because he was having some manic and depressive symptoms.  Lithium level requested was 0.6 at lower dosage of 675 mg daily.. Mood the last few days is  OK.  Dealing with some pain in TMJ that is affecting him. 2 nights of no sleep but otherwise normal sleep. Has used more caffeine than usual but only in the morning.    Patient reports stable mood and denies depressed or irritable moods.  Patient denies any recent difficulty with anxiety.  Patient denies difficulty with sleep initiation or maintenance. Denies appetite disturbance.  Patient reports that energy and motivation have been good.  Patient denies any difficulty with concentration.  Patient denies any suicidal ideation. Plan: He is not manic and no longer depressed after  increase lithium to 900 mg daily.  stable even with Vyvanse and doing well with lithium  12/06/19 appt with the following noted: All in all well.  Not sign manic.  Stayed on lithium 900.  Sleep good on 50 mg daily 7-8 hours.  No SE except  Not hungry on Vyvanse.  05/08/2020 appt noted: Mood OK overall.  But got worked up too much last week for about 3 hours but it only lasted those hours. Some problems with sleep without apparent reason though occ forgets it. Sometimes a night with no sleep 1-2 times a month.  Usually good sleep routine. Melatonin and benadryl  Health is good. Plan: Get labs as soon as possible  06/14/2020 phone call with patient:  Patient called because of emerging mania. MD response: Return telephone call to patient: Had information indirectly from his wife that the patient is manic. Patient reports only 1 hour of sleep last night, impulsive somewhat reckless decision making with poor concentration.  He feels revved up even and is up in his body at night.  He has been compliant with medications. No psychotic symptoms and no suicidal thoughts or homicidal thoughts. Discussed that the patient is having manic symptoms that are breaking through despite adequate lithium level of 1.0 just a few days ago.  Therefore going up and lithium is not a good option.  He is taking quetiapine 50 mg nightly.  Discussed the pros and cons of increasing quetiapine to manage this acute mania versus adding Depakote which is the other more common mood stabilizer.  It is likely that increasing quetiapine will work quicker because he will start sleeping better almost right away probably.  Discussed the dosing range of Seroquel for mania is 300 to 800 mg nightly.  Discussed the side effects of Seroquel. Increase Seroquel to 300 mg nightly and continue lithium. Schedule follow-up soon We will discussed the option of transitioning him to Depakote as an alternative to the lithium at his  appointment.  07/11/2020 appointment with the following noted:  Did the job with mood stab ilizing.  But was pretty hungover and reduced the dose to 150 mg HS and it seems fine.  Denies any manic sx and doesn't think wife sees him manic now.  Sleep is fine about 7-8 hours.   SE a bit harder getting up and going int he morning.  No clear triggers. Wife Marjean Donna thought his manic sx lasted about 4 weeks with volatility and difficulty functioning with work.  Hyperverbal and overinclusive and hypergraphia.  Noticed by others but resolved.  Couldn't stop himself.  Didn't really recognize mania until pointed out to him. Plan: OK increase Vyvanse 50 mg daily.   11/05/2020 appointment with the following noted: Doing well overall home and work and life fine.  Vyvanse helpful.   Continue meds.  Tried microdosing mushrooms over 6 weeks and it seemed to help.  Helped him be a better  person.   Plan: Needed increase Seroquel to 300 mg HS for recent mania not prevented by lithium .  But he says it's too sedating so dropped to 150.  . not depressed after increase lithium to 900 mg daily.   Continue Vyvanse 50 mg every morning  05/08/2021 appointment with the following noted: Never better as an adult. Taking low dose Seroquel 75 mg HS.  Sleeping well for the most part. No SE with meds. Satisfied with meds.  Benefit. Health has been good. Plan: For convenience we will reduce Seroquel to 50 mg nightly.  And also for tolerability. . not depressed after increase lithium to 900 mg daily.   Continue Vyvanse 50 mg every morning Check lithium labs  11/06/2021 appointment with the following noted: Doing well.  Business growing fast.  Hanging in there well.  No mood swings nor depression. Sleeping good with quetiapine 150 mg HS Continues lithium 900 and Vyvanse 50 Plan: No med changes: Continue Vyvanse 50 mg every morning Continue lithium 900 mg daily Continue quetiapine 150 mg nightly for treatment complicating  insomnia and bipolar disorder Check lithium level  05/12/2022 appointment noted: Meds as above with less quetiapine 100 mg HS with no hangover. Mood is good , happy and stable. Had work stress and insomnia early in month but resolved. Vyvanse working well. Still some OCD tendencies.  A lot of time in spreadsheets and perfectionistic about things.  Perfectionistic.   Socks rolled in certain way and all arranged. Plan: No med changes: Reduce Vyvanse 40 mg every morning bc question overdriven and perfectionism with spreadsheets etc Continue lithium 900 mg daily Continue quetiapine 150 mg nightly for treatment complicating insomnia and bipolar disorder  12/16/22 appt noted; Trying to reduce Seroquel to 100 mg HS . Sleep fine unless forgets it.   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 No sig mood swings.  Very stable. Patient reports stable mood and denies depressed or irritable moods.  Patient denies any recent difficulty with anxiety.  Patient denies difficulty with sleep initiation or maintenance. Denies appetite disturbance.  Patient reports that energy and motivation have been good.  Patient denies any difficulty with concentration.  Patient denies any suicidal ideation.  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 D doesn't drink bc pt has hx of alcohol dependence.  He's been sober for many years.  Attention problems.  Talked to wife.    Past psychiatric medication trials include Geodon which caused sedation, Zyprexa which caused sedation, Latuda which caused akathisia and Seroquel Lithium excellent response at 900 mg daily Vyvanse Found out Aunt has bipolar disabled.  Review of Systems:  Review of Systems  Respiratory:  Negative for shortness of breath.   Cardiovascular:  Negative for chest pain and palpitations.  Skin:  Positive for rash.  Neurological:  Negative  for dizziness, tremors and light-headedness.  Psychiatric/Behavioral:  Negative for agitation, behavioral problems, confusion, decreased concentration, dysphoric mood, hallucinations, self-injury, sleep disturbance and suicidal ideas. The patient is not nervous/anxious and is not hyperactive.     Medications: I have reviewed the patient's current medications.  Current Outpatient Medications  Medication Sig Dispense Refill   adalimumab (HUMIRA, 2 PEN,) 40 MG/0.8ML PNKT pen Inject 1 pen injector subcutaneously every two weeks 2 each 5   alfuzosin (UROXATRAL) 10 MG 24 hr tablet Take 1 tablet (10 mg total)  by mouth 2 (two) times daily. 180 tablet 0   atorvastatin (LIPITOR) 20 MG tablet Take 1 tablet (20 mg total) by mouth daily. 90 tablet 4   silodosin (RAPAFLO) 8 MG CAPS capsule Take 1 capsule (8 mg total) by mouth at bedtime. 30 capsule 11   lisdexamfetamine (VYVANSE) 40 MG capsule Take 1 capsule (40 mg total) by mouth daily. 90 capsule 0   lithium carbonate (ESKALITH) 450 MG ER tablet Take 1 tablet (450 mg total) by mouth 2 (two) times daily. 180 tablet 1   QUEtiapine (SEROQUEL) 50 MG tablet Take 3 tablets (150 mg total) by mouth at bedtime. 270 tablet 1   No current facility-administered medications for this visit.    Medication Side Effects: None  Allergies: No Known Allergies  Past Medical History:  Diagnosis Date   Bipolar disorder (HCC)    Depression    Psoriasis    Ulcer     Family History  Problem Relation Age of Onset   Hyperlipidemia Father    COPD Maternal Uncle     Social History   Socioeconomic History   Marital status: Married    Spouse name: Not on file   Number of children: Not on file   Years of education: Not on file   Highest education level: Not on file  Occupational History   Not on file  Tobacco Use   Smoking status: Never   Smokeless tobacco: Never  Substance and Sexual Activity   Alcohol use: Not on file   Drug use: Not on file   Sexual  activity: Not on file  Other Topics Concern   Not on file  Social History Narrative   Not on file   Social Determinants of Health   Financial Resource Strain: Not on file  Food Insecurity: Not on file  Transportation Needs: Not on file  Physical Activity: Not on file  Stress: Not on file  Social Connections: Not on file  Intimate Partner Violence: Not on file    Past Medical History, Surgical history, Social history, and Family history were reviewed and updated as appropriate.   Please see review of systems for further details on the patient's review from today.   Objective:   Physical Exam:  There were no vitals taken for this visit.  Physical Exam Neurological:     Mental Status: He is alert and oriented to person, place, and time.     Cranial Nerves: No dysarthria.  Psychiatric:        Attention and Perception: Attention and perception normal.        Mood and Affect: Mood normal. Mood is not anxious or depressed.        Speech: Speech normal.        Behavior: Behavior is cooperative.        Thought Content: Thought content normal. Thought content is not paranoid or delusional. Thought content does not include homicidal or suicidal ideation. Thought content does not include suicidal plan.        Cognition and Memory: Cognition and memory normal.        Judgment: Judgment normal.     Comments: Insight intact     Lab Review:     Component Value Date/Time   NA 141 12/15/2021 1624   K 4.1 12/15/2021 1624   CL 103 12/15/2021 1624   CO2 27 12/15/2021 1624   GLUCOSE 89 12/15/2021 1624   BUN 8 12/15/2021 1624   CREATININE 1.48 (H) 12/15/2021 1624  CALCIUM 9.8 12/15/2021 1624   PROT 8.3 05/21/2011 1753   ALBUMIN 4.4 05/21/2011 1753   AST 48 (H) 05/21/2011 1753   ALT 37 05/21/2011 1753   ALKPHOS 87 05/21/2011 1753   BILITOT 2.5 (H) 05/21/2011 1753   GFRNONAA 79 (L) 05/21/2011 1753   GFRAA >90 05/21/2011 1753       Component Value Date/Time   WBC 19.2 (H)  05/21/2011 1753   RBC 4.91 05/21/2011 1753   HGB 16.7 05/21/2011 1753   HCT 46.1 05/21/2011 1753   PLT 216 05/21/2011 1753   MCV 93.9 05/21/2011 1753   MCH 34.0 05/21/2011 1753   MCHC 36.2 (H) 05/21/2011 1753   RDW 13.0 05/21/2011 1753   LYMPHSABS 0.8 05/21/2011 1753   MONOABS 1.2 (H) 05/21/2011 1753   EOSABS 0.0 05/21/2011 1753   BASOSABS 0.0 05/21/2011 1753   lithium level October 21, 2017 0.4.  This was on 675 mg a day same his current dosage.  His level in November 18 was 1.10 900 mg a day.  The dosage was reduced because of elevated creatinine which in June was 1.53. Lithium Lvl  Date Value Ref Range Status  12/15/2021 1.0 0.6 - 1.2 mmol/L Final   This was on 900 mg lithium daily.  THS 2.54 12/23 Dr.K Cr 1.43 12/23 Pending FU PCP 11/15/21   No results found for: "PHENYTOIN", "PHENOBARB", "VALPROATE", "CBMZ"   Adult self-report scale for ADD score was 29 on inattentive side which is high and 23 on the hyperactive scale which is borderline high suggestive of ADHD  .res Assessment: Plan:    Claud was seen today for follow-up.  Diagnoses and all orders for this visit:  Bipolar I disorder, most recent episode (or current) manic (HCC) -     Lithium level -     Basic metabolic panel -     TSH -     lithium carbonate (ESKALITH) 450 MG ER tablet; Take 1 tablet (450 mg total) by mouth 2 (two) times daily. -     QUEtiapine (SEROQUEL) 50 MG tablet; Take 3 tablets (150 mg total) by mouth at bedtime.  Lithium use -     Lithium level -     Basic metabolic panel -     TSH  Attention deficit hyperactivity disorder (ADHD), combined type -     lisdexamfetamine (VYVANSE) 40 MG capsule; Take 1 capsule (40 mg total) by mouth daily.    30 min video face to face time with patient was spent on counseling and coordination of care. We discussed Bipolar disorder with a history of marked positive response to lithium CR creep    Hx borderline creatinine. Counseled patient regarding  potential benefits, risks, and side effects of lithium to include potential risk of lithium affecting thyroid and renal function.  Discussed need for periodic lab monitoring to determine drug level and to assess for potential adverse effects.  Counseled patient regarding signs and symptoms of lithium toxicity and advised that they notify office immediately or seek urgent medical attention if experiencing these signs and symptoms.  Patient advised to contact office with any questions or concerns.  12/15/2021 lithium level 1.0 on 900 mg daily, creatinine 1.48, calcium 9.8 04/08/2022 urinalysis unremarkable Check labs now  Needed increase Seroquel to 300 mg HS for recent mania not prevented by lithium in the past but has been stable on just 75 mg nightly as needed for sleep over the last several months..  He was too sedated on Seroquel 300  mg nightly. Continue Seroquel 100 mg nightly.  And also for tolerability. . not depressed after increase lithium to 900 mg daily.   Overall reports that during this last year his mood has been the best it has ever been.  May be hyperfocused on Vyvanse.  Disc at length difference between this and OC issues.  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 Productivity better and better on task with Vyvanse  PDMP is clear Disc shortage of vyvanse and dosing.  Call if any recurrence of mania.  He feels capable of recognizing the symptoms.  No med changes: Better with reduced Vyvanse 40 mg every morning bc question overdriven and perfectionism with spreadsheets etc Continue lithium 900 mg daily Continue quetiapine 100-150 mg nightly for treatment complicating insomnia and bipolar disorder Check lithium level and labs.    FU  6 mos   Meredith Staggers, MD, DFAPA   Please see After Visit Summary for patient specific instructions.  Future Appointments  Date Time Provider Department Center  04/14/2023 10:20 AM McKenzie, Mardene Celeste, MD [facility] None      Orders Placed This Encounter  Procedures   Lithium level   Basic metabolic panel   TSH      -------------------------------

## 2022-12-18 DIAGNOSIS — Z79899 Other long term (current) drug therapy: Secondary | ICD-10-CM | POA: Diagnosis not present

## 2022-12-18 DIAGNOSIS — F3110 Bipolar disorder, current episode manic without psychotic features, unspecified: Secondary | ICD-10-CM | POA: Diagnosis not present

## 2022-12-19 LAB — BASIC METABOLIC PANEL
BUN/Creatinine Ratio: 6 (calc) (ref 6–22)
BUN: 9 mg/dL (ref 7–25)
CO2: 26 mmol/L (ref 20–32)
Calcium: 10.1 mg/dL (ref 8.6–10.3)
Chloride: 103 mmol/L (ref 98–110)
Creat: 1.41 mg/dL — ABNORMAL HIGH (ref 0.70–1.30)
Glucose, Bld: 83 mg/dL (ref 65–139)
Potassium: 4 mmol/L (ref 3.5–5.3)
Sodium: 140 mmol/L (ref 135–146)

## 2022-12-19 LAB — LITHIUM LEVEL: Lithium Lvl: 0.9 mmol/L (ref 0.6–1.2)

## 2022-12-19 LAB — TSH: TSH: 3.06 m[IU]/L (ref 0.40–4.50)

## 2022-12-21 ENCOUNTER — Other Ambulatory Visit (HOSPITAL_COMMUNITY): Payer: Self-pay

## 2022-12-25 NOTE — Progress Notes (Signed)
Lithium level stable 0.9 Cr 1.41 stable since 2021 Normal TSH No changes indicated.

## 2022-12-29 ENCOUNTER — Other Ambulatory Visit (HOSPITAL_COMMUNITY): Payer: Self-pay

## 2022-12-30 ENCOUNTER — Other Ambulatory Visit (HOSPITAL_COMMUNITY): Payer: Self-pay

## 2022-12-30 DIAGNOSIS — H6691 Otitis media, unspecified, right ear: Secondary | ICD-10-CM | POA: Diagnosis not present

## 2022-12-30 DIAGNOSIS — Z23 Encounter for immunization: Secondary | ICD-10-CM | POA: Diagnosis not present

## 2022-12-30 MED ORDER — AMOXICILLIN 875 MG PO TABS
875.0000 mg | ORAL_TABLET | Freq: Two times a day (BID) | ORAL | 0 refills | Status: DC
  Filled 2022-12-30: qty 10, 5d supply, fill #0

## 2023-01-16 ENCOUNTER — Encounter (HOSPITAL_COMMUNITY): Payer: Self-pay

## 2023-02-03 ENCOUNTER — Other Ambulatory Visit (HOSPITAL_COMMUNITY): Payer: Self-pay

## 2023-03-03 ENCOUNTER — Other Ambulatory Visit (HOSPITAL_COMMUNITY): Payer: Self-pay

## 2023-04-01 ENCOUNTER — Other Ambulatory Visit: Payer: Self-pay | Admitting: Psychiatry

## 2023-04-01 ENCOUNTER — Telehealth: Payer: Self-pay | Admitting: Psychiatry

## 2023-04-01 DIAGNOSIS — R631 Polydipsia: Secondary | ICD-10-CM | POA: Diagnosis not present

## 2023-04-01 DIAGNOSIS — F3110 Bipolar disorder, current episode manic without psychotic features, unspecified: Secondary | ICD-10-CM

## 2023-04-01 DIAGNOSIS — R1310 Dysphagia, unspecified: Secondary | ICD-10-CM | POA: Diagnosis not present

## 2023-04-01 DIAGNOSIS — R1110 Vomiting, unspecified: Secondary | ICD-10-CM | POA: Diagnosis not present

## 2023-04-01 DIAGNOSIS — Z79899 Other long term (current) drug therapy: Secondary | ICD-10-CM

## 2023-04-01 DIAGNOSIS — G4700 Insomnia, unspecified: Secondary | ICD-10-CM | POA: Diagnosis not present

## 2023-04-01 NOTE — Telephone Encounter (Signed)
I would like to see him if possible.  It is tricky to restart lithium in context of NV.  See if he can come in about 1100 AM Friday 12.6.

## 2023-04-01 NOTE — Telephone Encounter (Signed)
PT called and LM around 10:30am.  He reports that he has been very sick since last Thursday and unable to keep meds down. He hasn't had meds since then. He has lost weight due to illness too. He would like a call to help him restart his meds.

## 2023-04-01 NOTE — Telephone Encounter (Signed)
Call and schedule next available

## 2023-04-01 NOTE — Telephone Encounter (Signed)
Patient is at [facility] and wants a lithium level and a CBC done. He ingested a drink last week that had THC in it (? Delta 8). He has been sick since, throwing up, can't keeps meds down.  He is asking how to get back on his meds. He sounds manic with rapid speech, says he knows what is wrong and it is a Dr. Jennelle Human issue and not a PCP issue.

## 2023-04-02 ENCOUNTER — Encounter: Payer: Self-pay | Admitting: Psychiatry

## 2023-04-02 ENCOUNTER — Ambulatory Visit: Payer: 59 | Admitting: Psychiatry

## 2023-04-02 DIAGNOSIS — Z79899 Other long term (current) drug therapy: Secondary | ICD-10-CM | POA: Diagnosis not present

## 2023-04-02 DIAGNOSIS — F902 Attention-deficit hyperactivity disorder, combined type: Secondary | ICD-10-CM | POA: Diagnosis not present

## 2023-04-02 DIAGNOSIS — F3110 Bipolar disorder, current episode manic without psychotic features, unspecified: Secondary | ICD-10-CM | POA: Diagnosis not present

## 2023-04-02 NOTE — Progress Notes (Signed)
Corey Reilly [id number] 23-Dec-1960 62 y.o.  Subjective:   Patient ID:  Corey Reilly is a 62 y.o. (DOB April 18, 1961) male.  Chief Complaint:  Chief Complaint  Patient presents with   Follow-up   Manic Behavior   Medication Reaction    Depression        Associated symptoms include no decreased concentration and no suicidal ideas.  Corey Reilly presents to the office today for follow-up of bipolar disorder and concerns about focus and possible ADD.  When seen December 2019.  No meds were changed.  Labs were received with low normal lithium level.  Creatinine was slightly elevated at 1.4.  When seen in June.  No meds were changed.  Was recommended that he try NAC 600 mg daily for cognition.  Completed ADHD self questionnaire which was high on both the inattention and hyperactivity scales.  Tried NAC 1000 mg with less memory issues but not dramatic.  Does think it's improved but not enough and wanted to pursue a trial of stimulants.  This was discussed with his wife who noted the following: Periods of poor function intermittently.  Gets distracted and then forgets.  Cannot control it.  Wonders if it's ADD.  D dx ADHD and the 2 of them are similar and she is on Adderall.  At [employer].  Constantly distracted.  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  Therefore at the visit in August we initiated a trial of Vyvanse 30 mg daily. He called back September 3 asking for refills stating it was working well.  He did increase Vyvanse to 40 mg daily.  seen February 06, 2019.  Rec trial NAC 1000 mg for STM and continue Vyvanse 30 as best tolerated dose.  No other med changes.  Overall doing OK.  Expected stress with Covid and politics.  Mental health is OK. Doesn't feel the Vyvanse in any kick or mood but focus is definitely better.  Last month is best month ever and much mor productive and efficient.  Lost a little weight on it.  SE low appetite so skips on the weekend.  Less  distractible with Vyvanse and only uses work days.    seen May 09, 2019.  No meds were changed.  08/07/19 appt with the following noted: He increased the lithium dose back to 900 mg daily about first of April, because he was having some manic and depressive symptoms.  Lithium level requested was 0.6 at lower dosage of 675 mg daily.. Mood the last few days is OK.  Dealing with some pain in TMJ that is affecting him. 2 nights of no sleep but otherwise normal sleep. Has used more caffeine than usual but only in the morning.    Patient reports stable mood and denies depressed or irritable moods.  Patient denies any recent difficulty with anxiety.  Patient denies difficulty with sleep initiation or maintenance. Denies appetite disturbance.  Patient reports that energy and motivation have been good.  Patient denies any difficulty with concentration.  Patient denies any suicidal ideation. Plan: He is not manic and no longer depressed after increase lithium to 900 mg daily.  stable even with Vyvanse and doing well with lithium  12/06/19 appt with the following noted: All in all well.  Not sign manic.  Stayed on lithium 900.  Sleep good on 50 mg daily 7-8 hours.  No SE except  Not hungry on Vyvanse.  05/08/2020 appt noted: Mood OK overall.  But got worked up too much  last week for about 3 hours but it only lasted those hours. Some problems with sleep without apparent reason though occ forgets it. Sometimes a night with no sleep 1-2 times a month.  Usually good sleep routine. Melatonin and benadryl  Health is good. Plan: Get labs as soon as possible  06/14/2020 phone call with patient:  Patient called because of emerging mania. MD response: Return telephone call to patient: Had information indirectly from his wife that the patient is manic. Patient reports only 1 hour of sleep last night, impulsive somewhat reckless decision making with poor concentration.  He feels revved up even and is up in his  body at night.  He has been compliant with medications. No psychotic symptoms and no suicidal thoughts or homicidal thoughts. Discussed that the patient is having manic symptoms that are breaking through despite adequate lithium level of 1.0 just a few days ago.  Therefore going up and lithium is not a good option.  He is taking quetiapine 50 mg nightly.  Discussed the pros and cons of increasing quetiapine to manage this acute mania versus adding Depakote which is the other more common mood stabilizer.  It is likely that increasing quetiapine will work quicker because he will start sleeping better almost right away probably.  Discussed the dosing range of Seroquel for mania is 300 to 800 mg nightly.  Discussed the side effects of Seroquel. Increase Seroquel to 300 mg nightly and continue lithium. Schedule follow-up soon We will discussed the option of transitioning him to Depakote as an alternative to the lithium at his appointment.  07/11/2020 appointment with the following noted:  Did the job with mood stab ilizing.  But was pretty hungover and reduced the dose to 150 mg HS and it seems fine.  Denies any manic sx and doesn't think wife sees him manic now.  Sleep is fine about 7-8 hours.   SE a bit harder getting up and going int he morning.  No clear triggers. Wife Corey Reilly thought his manic sx lasted about 4 weeks with volatility and difficulty functioning with work.  Hyperverbal and overinclusive and hypergraphia.  Noticed by others but resolved.  Couldn't stop himself.  Didn't really recognize mania until pointed out to him. Plan: OK increase Vyvanse 50 mg daily.   11/05/2020 appointment with the following noted: Doing well overall home and work and life fine.  Vyvanse helpful.   Continue meds.  Tried microdosing mushrooms over 6 weeks and it seemed to help.  Helped him be a better person.   Plan: Needed increase Seroquel to 300 mg HS for recent mania not prevented by lithium .  But he says it's  too sedating so dropped to 150.  . not depressed after increase lithium to 900 mg daily.   Continue Vyvanse 50 mg every morning  05/08/2021 appointment with the following noted: Never better as an adult. Taking low dose Seroquel 75 mg HS.  Sleeping well for the most part. No SE with meds. Satisfied with meds.  Benefit. Health has been good. Plan: For convenience we will reduce Seroquel to 50 mg nightly.  And also for tolerability. . not depressed after increase lithium to 900 mg daily.   Continue Vyvanse 50 mg every morning Check lithium labs  11/06/2021 appointment with the following noted: Doing well.  Business growing fast.  Hanging in there well.  No mood swings nor depression. Sleeping good with quetiapine 150 mg HS Continues lithium 900 and Vyvanse 50 Plan: No med changes:  Continue Vyvanse 50 mg every morning Continue lithium 900 mg daily Continue quetiapine 150 mg nightly for treatment complicating insomnia and bipolar disorder Check lithium level  05/12/2022 appointment noted: Meds as above with less quetiapine 100 mg HS with no hangover. Mood is good , happy and stable. Had work stress and insomnia early in month but resolved. Vyvanse working well. Still some OCD tendencies.  A lot of time in spreadsheets and perfectionistic about things.  Perfectionistic.   Socks rolled in certain way and all arranged. Plan: No med changes: Reduce Vyvanse 40 mg every morning bc question overdriven and perfectionism with spreadsheets etc Continue lithium 900 mg daily Continue quetiapine 150 mg nightly for treatment complicating insomnia and bipolar disorder  12/16/22 appt noted; Trying to reduce Seroquel to 100 mg HS . Sleep fine unless forgets it.   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 No sig mood swings.  Very stable. Patient reports stable mood and denies depressed or irritable moods.   Patient denies any recent difficulty with anxiety.  Patient denies difficulty with sleep initiation or maintenance. Denies appetite disturbance.  Patient reports that energy and motivation have been good.  Patient denies any difficulty with concentration.  Patient denies any suicidal ideation.  04/01/23 TC:  PT called and LM around 10:30am.  He reports that he has been very sick since last Thursday and unable to keep meds down. He hasn't had meds since then. He has lost weight due to illness too. He would like a call to help him restart his meds.    Franchot Erichsen, [place]    04/01/23  1:34 PM Note Patient is at [facility] and wants a lithium level and a CBC done. He ingested a drink last week that had THC in it (? Delta 8). He has been sick since, throwing up, can't keeps meds down.  He is asking how to get back on his meds. He sounds manic with rapid speech, says he knows what is wrong and it is a Dr. Jennelle Human issue and not a PCP issue.     MD resp:   04/01/23  6:50 PM Note I would like to see him if possible.  It is tricky to restart lithium in context of NV.  See if he can come in about 1100 AM Friday 12.6.        04/02/23 appt noted: urgent seen with wife who is a nurse [employer] "manic" T'giving day family.  M colon CA and here with 20 people and was intense.  M in last year.  Experimenting with THC bevarages sold downtown.  To help with anxiety. Wife doesn't think it is going well. She and other family noticed he look impaired at family gathering.   After stopping THC he started vomiting.  No diarrhea.  Not sleeping for several days until last night. Increased Seroquel and still not sleeping.  Not eating for a week.  Excessive thirst.  No vomiting since Monday.  Excessive thirst since THC.   Saw PCP yesterday and labs drawn and given Lunesta last night.   Last THC delta 9 product was last Thursday.   She notices he's having more general anxiety and dealing with people.  Recognizes now had more anxiety.   Some social avoidance.   Business doing very well.   Missed 3-4 days of lithium.  Trying to catch up on lithium.  Taking  it 3-4 days in a row.   Meds: lithobid 450 BID, Vyvanse 40 AM, quetiapine 100 mg HS.  Lunesta last night. No mania until THC.  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 D doesn't drink bc pt has hx of alcohol dependence.  He's been sober for many years.  Attention problems.  Talked to wife.    Past psychiatric medication trials : include Geodon which caused sedation, Zyprexa which caused sedation, Latuda which caused akathisia and Seroquel Lithium excellent response at 900 mg daily Vyvanse Found out Aunt has bipolar disabled.  Review of Systems:  Review of Systems  Respiratory:  Negative for shortness of breath.   Cardiovascular:  Negative for chest pain and palpitations.  Skin:  Positive for rash.  Neurological:  Negative for dizziness, tremors and light-headedness.  Psychiatric/Behavioral:  Positive for agitation, behavioral problems and sleep disturbance. Negative for confusion, decreased concentration, dysphoric mood, hallucinations, self-injury and suicidal ideas. The patient is nervous/anxious and is hyperactive.     Medications: I have reviewed the patient's current medications.  Current Outpatient Medications  Medication Sig Dispense Refill   adalimumab (HUMIRA, 2 PEN,) 40 MG/0.8ML PNKT pen Inject 1 pen injector subcutaneously every two weeks 2 each 5   alfuzosin (UROXATRAL) 10 MG 24 hr tablet Take 1 tablet (10 mg total) by mouth 2 (two) times daily. 180 tablet 0   amoxicillin (AMOXIL) 875 MG tablet Take 1 tablet (875 mg total) by mouth 2 (two) times daily for 5 days 10 tablet 0   atorvastatin (LIPITOR) 20 MG tablet Take 1 tablet (20 mg total) by mouth daily. 90 tablet 4   lisdexamfetamine (VYVANSE) 40 MG capsule Take 1 capsule (40 mg total) by mouth daily. 90 capsule 0   lithium carbonate (ESKALITH) 450 MG ER tablet Take 1 tablet  (450 mg total) by mouth 2 (two) times daily. 180 tablet 1   QUEtiapine (SEROQUEL) 50 MG tablet Take 3 tablets (150 mg total) by mouth at bedtime. 270 tablet 1   silodosin (RAPAFLO) 8 MG CAPS capsule Take 1 capsule (8 mg total) by mouth at bedtime. 30 capsule 11   No current facility-administered medications for this visit.    Medication Side Effects: None  Allergies: No Known Allergies  Past Medical History:  Diagnosis Date   Bipolar disorder (HCC)    Depression    Psoriasis    Ulcer     Family History  Problem Relation Age of Onset   Hyperlipidemia Father    COPD Maternal Uncle     Social History   Socioeconomic History   Marital status: Married    Spouse name: Not on file   Number of children: Not on file   Years of education: Not on file   Highest education level: Not on file  Occupational History   Not on file  Tobacco Use   Smoking status: Never   Smokeless tobacco: Never  Substance and Sexual Activity   Alcohol use: Not on file   Drug use: Not on file   Sexual activity: Not on file  Other Topics Concern   Not on file  Social History Narrative   Not on file   Social Determinants of Health   Financial Resource Strain: Not on file  Food Insecurity: Not on file  Transportation Needs: Not on file  Physical Activity: Not on file  Stress: Not on file  Social Connections: Not on file  Intimate Partner Violence: Not on file  Past Medical History, Surgical history, Social history, and Family history were reviewed and updated as appropriate.   Please see review of systems for further details on the patient's review from today.   Objective:   Physical Exam:  There were no vitals taken for this visit.  Physical Exam Constitutional:      General: He is not in acute distress.    Appearance: He is well-developed.  Musculoskeletal:        General: No deformity.  Neurological:     Mental Status: He is alert and oriented to person, place, and time.      Cranial Nerves: No dysarthria.     Coordination: Coordination normal.  Psychiatric:        Attention and Perception: Attention and perception normal.        Mood and Affect: Mood is anxious. Mood is not depressed. Affect is inappropriate. Affect is not angry.        Speech: Speech is rapid and pressured.        Behavior: Behavior is hyperactive. Behavior is not agitated or aggressive. Behavior is cooperative.        Thought Content: Thought content normal. Thought content is not paranoid or delusional. Thought content does not include homicidal or suicidal ideation. Thought content does not include homicidal or suicidal plan.        Cognition and Memory: Cognition, memory and cognition and memory normal.        Judgment: Judgment normal.     Comments: Insight fair.  Some addiction px resurfacing Manic with pressured speech but directable and able to reason . No psychosis. No sig anger     Lab Review:     Component Value Date/Time   NA 140 12/18/2022 1522   K 4.0 12/18/2022 1522   CL 103 12/18/2022 1522   CO2 26 12/18/2022 1522   GLUCOSE 83 12/18/2022 1522   BUN 9 12/18/2022 1522   CREATININE 1.41 (H) 12/18/2022 1522   CALCIUM 10.1 12/18/2022 1522   PROT 8.3 05/21/2011 1753   ALBUMIN 4.4 05/21/2011 1753   AST 48 (H) 05/21/2011 1753   ALT 37 05/21/2011 1753   ALKPHOS 87 05/21/2011 1753   BILITOT 2.5 (H) 05/21/2011 1753   GFRNONAA 79 (L) 05/21/2011 1753   GFRAA >90 05/21/2011 1753       Component Value Date/Time   WBC 19.2 (H) 05/21/2011 1753   RBC 4.91 05/21/2011 1753   HGB 16.7 05/21/2011 1753   HCT 46.1 05/21/2011 1753   PLT 216 05/21/2011 1753   MCV 93.9 05/21/2011 1753   MCH 34.0 05/21/2011 1753   MCHC 36.2 (H) 05/21/2011 1753   RDW 13.0 05/21/2011 1753   LYMPHSABS 0.8 05/21/2011 1753   MONOABS 1.2 (H) 05/21/2011 1753   EOSABS 0.0 05/21/2011 1753   BASOSABS 0.0 05/21/2011 1753   lithium level October 21, 2017 0.4.  This was on 675 mg a day same his current dosage.   His level in November 18 was 1.10 900 mg a day.  The dosage was reduced because of elevated creatinine which in June was 1.53. Lithium Lvl  Date Value Ref Range Status  12/18/2022 0.9 0.6 - 1.2 mmol/L Final   This was on 900 mg lithium daily.  THS 2.54 12/23 Dr.K Cr 1.43 12/23 Pending FU PCP 11/15/21   No results found for: "PHENYTOIN", "PHENOBARB", "VALPROATE", "CBMZ"   Adult self-report scale for ADD score was 29 on inattentive side which is high and 23 on the hyperactive  scale which is borderline high suggestive of ADHD  .res Assessment: Plan:    There are no diagnoses linked to this encounter.   30 min video face to face time with patient was spent on counseling and coordination of care. We discussed Bipolar disorder with a history of marked positive response to lithium CR creep    Hx borderline creatinine. Counseled patient regarding potential benefits, risks, and side effects of lithium to include potential risk of lithium affecting thyroid and renal function.  Discussed need for periodic lab monitoring to determine drug level and to assess for potential adverse effects.  Counseled patient regarding signs and symptoms of lithium toxicity and advised that they notify office immediately or seek urgent medical attention if experiencing these signs and symptoms.  Patient advised to contact office with any questions or concerns.   After stable repeat labs to RO diabetes insipidus vs SE thirst from meds.  12/15/2021 lithium level 1.0 on 900 mg daily, creatinine 1.48, calcium 9.8 04/08/2022 urinalysis unremarkable  Needed increase Seroquel to 300 mg HS for recent mania not prevented by lithium in the past but has been stable on just 75 mg nightly as needed for sleep over the last several months..  He was too sedated on Seroquel 300 mg nightly before but needs higher dose until mania managed.  . not depressed after increase lithium to 900 mg daily.   Overall reports that during this  last year his mood has been the best it has ever been, but is apparently dealing with anxiety causing social avoidance.  May be hyperfocused on Vyvanse.  Disc at length difference between this and OC issues.  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 Productivity better and better on task with Vyvanse  PDMP is clear Disc shortage of vyvanse and dosing.  Counseling 20 min on No THC products.  Disc addiction mind set and risk of experimenting with these products which have triggered his mania.  Will look at other options for anxiety like buspirone or propranolol, etc  Call if any worsening of mania.  He feels capable of recognizing the symptoms.  Better with reduced Vyvanse 40 mg every morning bc question overdriven and perfectionism with spreadsheets etc Continue lithium 900 mg daily  BC mania increase quetiapine 300  mg nightly for treatment complicating insomnia and recent onset mania Check lithium level and labs pending.    FU  1-2 weeks  Meredith Staggers, MD, DFAPA   Please see After Visit Summary for patient specific instructions.  Future Appointments  Date Time Provider Department Center  04/14/2023 10:20 AM McKenzie, Mardene Celeste, MD [facility] None     No orders of the defined types were placed in this encounter.     -------------------------------

## 2023-04-02 NOTE — Telephone Encounter (Signed)
He is scheduled for 11am today

## 2023-04-08 ENCOUNTER — Other Ambulatory Visit: Payer: Self-pay | Admitting: Urology

## 2023-04-08 ENCOUNTER — Other Ambulatory Visit (HOSPITAL_COMMUNITY): Payer: Self-pay

## 2023-04-08 ENCOUNTER — Other Ambulatory Visit: Payer: Self-pay | Admitting: Internal Medicine

## 2023-04-08 ENCOUNTER — Other Ambulatory Visit: Payer: Self-pay | Admitting: Psychiatry

## 2023-04-08 ENCOUNTER — Other Ambulatory Visit: Payer: Self-pay

## 2023-04-08 DIAGNOSIS — R09A2 Foreign body sensation, throat: Secondary | ICD-10-CM | POA: Diagnosis not present

## 2023-04-08 DIAGNOSIS — F902 Attention-deficit hyperactivity disorder, combined type: Secondary | ICD-10-CM

## 2023-04-08 DIAGNOSIS — Z800 Family history of malignant neoplasm of digestive organs: Secondary | ICD-10-CM | POA: Diagnosis not present

## 2023-04-08 DIAGNOSIS — R112 Nausea with vomiting, unspecified: Secondary | ICD-10-CM | POA: Diagnosis not present

## 2023-04-08 DIAGNOSIS — R194 Change in bowel habit: Secondary | ICD-10-CM | POA: Diagnosis not present

## 2023-04-08 MED ORDER — LISDEXAMFETAMINE DIMESYLATE 40 MG PO CAPS
40.0000 mg | ORAL_CAPSULE | Freq: Every day | ORAL | 0 refills | Status: DC
Start: 2023-04-08 — End: 2023-07-01
  Filled 2023-04-08: qty 30, 30d supply, fill #0

## 2023-04-08 NOTE — Telephone Encounter (Signed)
Pt has appt tomorrow 12/13; LF 08/21

## 2023-04-09 ENCOUNTER — Ambulatory Visit: Payer: 59 | Admitting: Psychiatry

## 2023-04-09 ENCOUNTER — Other Ambulatory Visit: Payer: Self-pay

## 2023-04-10 ENCOUNTER — Encounter (HOSPITAL_COMMUNITY): Payer: Self-pay

## 2023-04-11 ENCOUNTER — Encounter (HOSPITAL_COMMUNITY): Payer: Self-pay

## 2023-04-12 ENCOUNTER — Other Ambulatory Visit (HOSPITAL_COMMUNITY): Payer: Self-pay

## 2023-04-12 ENCOUNTER — Telehealth: Payer: Self-pay | Admitting: Urology

## 2023-04-12 MED ORDER — SILODOSIN 8 MG PO CAPS
8.0000 mg | ORAL_CAPSULE | Freq: Every evening | ORAL | 11 refills | Status: DC
  Filled 2023-04-12 (×2): qty 30, 30d supply, fill #0

## 2023-04-12 NOTE — Telephone Encounter (Signed)
Rx refilled by MD on 12/16.  Patient notified.

## 2023-04-12 NOTE — Telephone Encounter (Signed)
Rx refilled and patient notified via mychart.

## 2023-04-12 NOTE — Telephone Encounter (Signed)
Ran out of silodosin and cannot urinate. Needs rx sent to [facility] in [place].

## 2023-04-13 DIAGNOSIS — E876 Hypokalemia: Secondary | ICD-10-CM | POA: Diagnosis not present

## 2023-04-14 ENCOUNTER — Other Ambulatory Visit (HOSPITAL_COMMUNITY): Payer: Self-pay

## 2023-04-14 ENCOUNTER — Ambulatory Visit: Payer: 59 | Admitting: Urology

## 2023-04-14 ENCOUNTER — Encounter: Payer: Self-pay | Admitting: Family Medicine

## 2023-04-14 ENCOUNTER — Encounter: Payer: Self-pay | Admitting: Urology

## 2023-04-14 VITALS — BP 147/76 | HR 106

## 2023-04-14 DIAGNOSIS — R351 Nocturia: Secondary | ICD-10-CM | POA: Diagnosis not present

## 2023-04-14 DIAGNOSIS — N400 Enlarged prostate without lower urinary tract symptoms: Secondary | ICD-10-CM | POA: Diagnosis not present

## 2023-04-14 LAB — URINALYSIS, ROUTINE W REFLEX MICROSCOPIC
Bilirubin, UA: NEGATIVE
Glucose, UA: NEGATIVE
Ketones, UA: NEGATIVE
Leukocytes,UA: NEGATIVE
Nitrite, UA: NEGATIVE
Protein,UA: NEGATIVE
RBC, UA: NEGATIVE
Specific Gravity, UA: 1.01 (ref 1.005–1.030)
Urobilinogen, Ur: 0.2 mg/dL (ref 0.2–1.0)
pH, UA: 8 — ABNORMAL HIGH (ref 5.0–7.5)

## 2023-04-14 LAB — BLADDER SCAN AMB NON-IMAGING: Scan Result: 198

## 2023-04-14 MED ORDER — SILODOSIN 8 MG PO CAPS
8.0000 mg | ORAL_CAPSULE | Freq: Every evening | ORAL | 11 refills | Status: DC
  Filled 2023-04-14 – 2023-05-05 (×3): qty 30, 30d supply, fill #0
  Filled 2023-06-01: qty 30, 30d supply, fill #1
  Filled 2023-07-01: qty 30, 30d supply, fill #2
  Filled 2023-07-31: qty 30, 30d supply, fill #3
  Filled 2023-08-29: qty 30, 30d supply, fill #4
  Filled 2023-09-27: qty 30, 30d supply, fill #5
  Filled 2023-11-02: qty 30, 30d supply, fill #6

## 2023-04-14 NOTE — Patient Instructions (Signed)

## 2023-04-14 NOTE — Progress Notes (Signed)
post void residual=198

## 2023-04-14 NOTE — Progress Notes (Signed)
04/14/2023 10:30 AM   Corey Reilly 01-Aug-1960 [id number]  Referring provider: Darrow Bussing, MD [address] [place],  [place] [address]  Followup BPH   HPI: [name] is a 62yo here for followup for BPH and nocturia. IPSS 15 QOL 3 on rapaflo 8mg  daily. He could not take the rapaflo for 1 week when he had gastroenteritis and his urination significantly worsened. Urination is now back to baseline. Nocturia 1-3x.    PMH: Past Medical History:  Diagnosis Date   Bipolar disorder (HCC)    Depression    Psoriasis    Ulcer     Surgical History: No past surgical history on file.  Home Medications:  Allergies as of 04/14/2023   No Known Allergies      Medication List        Accurate as of April 14, 2023 10:30 AM. If you have any questions, ask your nurse or doctor.          STOP taking these medications    alfuzosin 10 MG 24 hr tablet Commonly known as: UROXATRAL       TAKE these medications    atorvastatin 20 MG tablet Commonly known as: LIPITOR Take 1 tablet (20 mg total) by mouth daily.   Humira (2 Pen) 40 MG/0.8ML Ajkt pen Generic drug: adalimumab Inject 1 pen injector subcutaneously every two weeks   lisdexamfetamine 40 MG capsule Commonly known as: VYVANSE Take 1 capsule (40 mg total) by mouth daily.   lithium carbonate 450 MG ER tablet Commonly known as: ESKALITH Take 1 tablet (450 mg total) by mouth 2 (two) times daily.   QUEtiapine 50 MG tablet Commonly known as: SEROQUEL Take 3 tablets (150 mg total) by mouth at bedtime.   silodosin 8 MG Caps capsule Commonly known as: RAPAFLO Take 1 capsule (8 mg total) by mouth at bedtime.        Allergies: No Known Allergies  Family History: Family History  Problem Relation Age of Onset   Hyperlipidemia Father    COPD Maternal Uncle     Social History:  reports that he has never smoked. He has never used smokeless tobacco. No history on file for alcohol use and  drug use.  ROS: All other review of systems were reviewed and are negative except what is noted above in HPI  Physical Exam: BP (!) 147/76   Pulse (!) 106   Constitutional:  Alert and oriented, No acute distress. HEENT: [place] AT, moist mucus membranes.  Trachea midline, no masses. Cardiovascular: No clubbing, cyanosis, or edema. Respiratory: Normal respiratory effort, no increased work of breathing. GI: Abdomen is soft, nontender, nondistended, no abdominal masses GU: No CVA tenderness.  Lymph: No cervical or inguinal lymphadenopathy. Skin: No rashes, bruises or suspicious lesions. Neurologic: Grossly intact, no focal deficits, moving all 4 extremities. Psychiatric: Normal mood and affect.  Laboratory Data: Lab Results  Component Value Date   WBC 19.2 (H) 05/21/2011   HGB 16.7 05/21/2011   HCT 46.1 05/21/2011   MCV 93.9 05/21/2011   PLT 216 05/21/2011    Lab Results  Component Value Date   CREATININE 1.41 (H) 12/18/2022    No results found for: "PSA"  No results found for: "TESTOSTERONE"  No results found for: "HGBA1C"  Urinalysis    Component Value Date/Time   COLORURINE YELLOW 05/21/2011 1807   APPEARANCEUR Clear 04/08/2022 1052   LABSPEC 1.020 05/21/2011 1807   PHURINE 6.0 05/21/2011 1807   GLUCOSEU Negative 04/08/2022 1052  HGBUR NEGATIVE 05/21/2011 1807   BILIRUBINUR Negative 04/08/2022 1052   KETONESUR >80 (A) 05/21/2011 1807   PROTEINUR Negative 04/08/2022 1052   PROTEINUR 30 (A) 05/21/2011 1807   UROBILINOGEN 1.0 05/21/2011 1807   NITRITE Negative 04/08/2022 1052   NITRITE NEGATIVE 05/21/2011 1807   LEUKOCYTESUR Negative 04/08/2022 1052    Lab Results  Component Value Date   LABMICR Comment 04/08/2022   BACTERIA RARE 05/21/2011    Pertinent Imaging:  No results found for this or any previous visit.  No results found for this or any previous visit.  No results found for this or any previous visit.  No results found for this or any previous  visit.  No results found for this or any previous visit.  No results found for this or any previous visit.  No results found for this or any previous visit.  No results found for this or any previous visit.   Assessment & Plan:    1. Benign prostatic hyperplasia, unspecified whether lower urinary tract symptoms present (Primary) Continue rapalfo 8mg  daily  - Urinalysis, Routine w reflex microscopic - BLADDER SCAN AMB NON-IMAGING  2. Nocturia Continue rapaflo 8mg  daily   No follow-ups on file.  Wilkie Aye, MD  [facility]

## 2023-04-19 ENCOUNTER — Telehealth: Payer: Self-pay | Admitting: Psychiatry

## 2023-04-19 NOTE — Telephone Encounter (Signed)
Corey Reilly called at 10:25 to report that he really isn't doing that well.  He is not sleeping.  We had to cancel his appt for 12/13.  Is rescheduled for 1/3 due to his status. He requested a refill of the Lunesta to at least help until he can get in for his appt.  Send to  [facility]

## 2023-04-19 NOTE — Telephone Encounter (Signed)
EMERGENCY LUNESTA 3/4 WEEKS AGO.   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 He took 1 lunesta tablet last night (he is unsure of the mg) and it gave him a full nights rest (which he has needed). He would like an rx. He says that he has very little caffine intake. He reprots he gets about 2/3 hours of sleep between 5 am and 8 am. He has upped the dose of Seroquel it does not make a difference. He is having a hard time falling asleep. He tried to put some background noise on to help fall asleep but does not seem to help.  Please advise.

## 2023-04-22 ENCOUNTER — Other Ambulatory Visit (HOSPITAL_COMMUNITY): Payer: Self-pay

## 2023-04-23 ENCOUNTER — Other Ambulatory Visit (HOSPITAL_COMMUNITY): Payer: Self-pay

## 2023-04-23 ENCOUNTER — Other Ambulatory Visit: Payer: Self-pay | Admitting: Psychiatry

## 2023-04-23 MED ORDER — ESZOPICLONE 3 MG PO TABS
3.0000 mg | ORAL_TABLET | Freq: Every day | ORAL | 0 refills | Status: DC
  Filled 2023-04-23: qty 15, 15d supply, fill #0

## 2023-04-23 NOTE — Telephone Encounter (Signed)
I sent Lunesta.  If this doesn't work to resolve mania, we will need to increase quetiapine or start something else.

## 2023-04-23 NOTE — Telephone Encounter (Signed)
Pt.notified

## 2023-04-27 ENCOUNTER — Telehealth: Payer: 59

## 2023-04-27 DIAGNOSIS — K140 Glossitis: Secondary | ICD-10-CM | POA: Diagnosis not present

## 2023-04-28 MED ORDER — NYSTATIN 100000 UNIT/ML MT SUSP: 10.0000 mL | Freq: Three times a day (TID) | OROMUCOSAL | 0 refills | Status: AC

## 2023-04-28 NOTE — Progress Notes (Signed)
 E-Visit for Mouth Ulcers  We are sorry that you are not feeling well.  Here is how we plan to help!  Based on what you have shared with me, it appears that you do have mouth ulcer(s).     The following medications should decrease the discomfort and help with healing. I have prescribed Magic Mouthwash Swish 10mL for about 15-30 seconds and spit out, Use 3 times daily for 5 days.  Mouth ulcers are painful areas in the mouth and gums. These are also known as "canker sores".  They can occur anywhere inside the mouth. While mostly harmless, mouth ulcers can be extremely uncomfortable and may make it difficult to eat, drink, and brush your teeth.  You may have more than 1 ulcer and they can vary and change in size. Mouth ulcers are not contagious and should not be confused with cold sores.  Cold sores appear on the lip or around the outside of the mouth and often begin with a tingling, burning or itching sensation.   While the exact causes are unknown, some common causes and factors that may aggravate mouth ulcers include: Genetics - Sometimes mouth ulcers run in families High alcohol intake Acidic foods such as citrus fruits like pineapple, grapefruit, orange fruits/juices, may aggravate mouth ulcers Other foods high in acidity or spice such as coffee, chocolate, chips, pretzels, eggs, nuts, cheese Quitting smoking Injury caused by biting the tongue or inside of the cheek Diet lacking in B-12, zinc, folic acid or iron Male hormone shifts with menstruation Excessive fatigue, emotional stress or anxiety Prevention: Talk to your doctor if you are taking meds that are known to cause mouth ulcers such as:   Anti-inflammatory drugs (for example Ibuprofen, Naproxen sodium), pain killers, Beta blockers, Oral nicotine replacement drugs, Some street drugs (heroin).   Avoid allowing any tablets to dissolve in your mouth that are meant to swallowed whole Avoid foods/drinks that trigger or worsen  symptoms Keep your mouth clean with daily brushing and flossing  Home Care: The goal with treatment is to ease the pain where ulcers occur and help them heal as quickly as possible.  There is no medical treatment to prevent mouth ulcers from coming back or recurring.  Avoid spicy and acidic foods Eat soft foods and avoid rough, crunchy foods Avoid chewing gum Do not use toothpaste that contains sodium lauryl sulphite Use a straw to drink which helps avoid liquids toughing the ulcers near the front of your mouth Use a very soft toothbrush If you have dentures or dental hardware that you feel is not fitting well or contributing to his, please see your dentist. Use saltwater mouthwash which helps healing. Dissolve a  teaspoon of salt in a glass of warm water. Swish around your mouth and spit it out. This can be used as needed if it is soothing.   GET HELP RIGHT AWAY IF: Persistent ulcers require checking IN PERSON (face to face). Any mouth lesion lasting longer than a month should be seen by your DENTIST as soon as possible for evaluation for possible oral cancer. If you have a non-painful ulcer in 1 or more areas of your mouth Ulcers that are spreading, are very large or particularly painful Ulcers last longer than one week without improving on treatment If you develop a fever, swollen glands and begin to feel unwell Ulcers that developed after starting a new medication MAKE SURE YOU: Understand these instructions. Will watch your condition. Will get help right away if you are  not doing well or get worse.  Thank you for choosing an e-visit.  Your e-visit answers were reviewed by a board certified advanced clinical practitioner to complete your personal care plan. Depending upon the condition, your plan could have included both over the counter or prescription medications.  Please review your pharmacy choice. Make sure the pharmacy is open so you can pick up prescription now. If there is  a problem, you may contact your provider through [employer] and have the prescription routed to another pharmacy.  Your safety is important to us . If you have drug allergies check your prescription carefully.   For the next 24 hours you can use MyChart to ask questions about today's visit, request a non-urgent call back, or ask for a work or school excuse. You will get an email in the next two days asking about your experience. I hope that your e-visit has been valuable and will speed your recovery.   I have spent 5 minutes in review of e-visit questionnaire, review and updating patient chart, medical decision making and response to patient.   Delon CHRISTELLA Dickinson, PA-C

## 2023-04-29 ENCOUNTER — Other Ambulatory Visit (HOSPITAL_COMMUNITY): Payer: Self-pay

## 2023-04-30 ENCOUNTER — Other Ambulatory Visit (HOSPITAL_COMMUNITY): Payer: Self-pay

## 2023-04-30 ENCOUNTER — Ambulatory Visit (INDEPENDENT_AMBULATORY_CARE_PROVIDER_SITE_OTHER): Payer: Commercial Managed Care - PPO | Admitting: Psychiatry

## 2023-04-30 ENCOUNTER — Encounter: Payer: Self-pay | Admitting: Psychiatry

## 2023-04-30 DIAGNOSIS — F902 Attention-deficit hyperactivity disorder, combined type: Secondary | ICD-10-CM

## 2023-04-30 DIAGNOSIS — F3110 Bipolar disorder, current episode manic without psychotic features, unspecified: Secondary | ICD-10-CM

## 2023-04-30 DIAGNOSIS — Z79899 Other long term (current) drug therapy: Secondary | ICD-10-CM

## 2023-04-30 MED ORDER — QUETIAPINE FUMARATE 100 MG PO TABS
400.0000 mg | ORAL_TABLET | Freq: Every day | ORAL | 1 refills | Status: DC
  Filled 2023-04-30: qty 120, 30d supply, fill #0
  Filled 2023-06-01: qty 120, 30d supply, fill #1

## 2023-04-30 NOTE — Progress Notes (Signed)
 Corey Reilly [id number] [birth year] 62 y.o.  Subjective:   Patient ID:  Corey Reilly is a 63 y.o. (DOB 1960/12/19) male.  Chief Complaint:  Chief Complaint  Patient presents with   Follow-up   Manic Behavior   Sleeping Problem    Depression        Associated symptoms include no decreased concentration and no suicidal ideas.  Corey Reilly presents to the office today for follow-up of bipolar disorder and concerns about focus and possible ADD.  When seen December 2019.  No meds were changed.  Labs were received with low normal lithium  level.  Creatinine was slightly elevated at 1.4.  When seen in June.  No meds were changed.  Was recommended that he try NAC 600 mg daily for cognition.  Completed ADHD self questionnaire which was high on both the inattention and hyperactivity scales.  Tried NAC 1000 mg with less memory issues but not dramatic.  Does think it's improved but not enough and wanted to pursue a trial of stimulants.  This was discussed with his wife who noted the following: Periods of poor function intermittently.  Gets distracted and then forgets.  Cannot control it.  Wonders if it's ADD.  D dx ADHD and the 2 of them are similar and she is on Adderall.  At [employer].  Constantly distracted.  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  Therefore at the visit in August we initiated a trial of Vyvanse  30 mg daily. He called back September 3 asking for refills stating it was working well.  He did increase Vyvanse  to 40 mg daily.  seen February 06, 2019.  Rec trial NAC 1000 mg for STM and continue Vyvanse  30 as best tolerated dose.  No other med changes.  Overall doing OK.  Expected stress with Covid and politics.  Mental health is OK. Doesn't feel the Vyvanse  in any kick or mood but focus is definitely better.  Last month is best month ever and much mor productive and efficient.  Lost a little weight on it.  SE low appetite so skips on the weekend.  Less  distractible with Vyvanse  and only uses work days.    seen May 09, 2019.  No meds were changed.  08/07/19 appt with the following noted: He increased the lithium  dose back to 900 mg daily about first of April, because he was having some manic and depressive symptoms.  Lithium  level requested was 0.6 at lower dosage of 675 mg daily.. Mood the last few days is OK.  Dealing with some pain in TMJ that is affecting him. 2 nights of no sleep but otherwise normal sleep. Has used more caffeine than usual but only in the morning.    Patient reports stable mood and denies depressed or irritable moods.  Patient denies any recent difficulty with anxiety.  Patient denies difficulty with sleep initiation or maintenance. Denies appetite disturbance.  Patient reports that energy and motivation have been good.  Patient denies any difficulty with concentration.  Patient denies any suicidal ideation. Plan: He is not manic and no longer depressed after increase lithium  to 900 mg daily.  stable even with Vyvanse  and doing well with lithium   12/06/19 appt with the following noted: All in all well.  Not sign manic.  Stayed on lithium  900.  Sleep good on 50 mg daily 7-8 hours.  No SE except  Not hungry on Vyvanse .  05/08/2020 appt noted: Mood OK overall.  But got worked up too much  last week for about 3 hours but it only lasted those hours. Some problems with sleep without apparent reason though occ forgets it. Sometimes a night with no sleep 1-2 times a month.  Usually good sleep routine. Melatonin and benadryl  Health is good. Plan: Get labs as soon as possible  06/14/2020 phone call with patient:  Patient called because of emerging mania. MD response: Return telephone call to patient: Had information indirectly from his wife that the patient is manic. Patient reports only 1 hour of sleep last night, impulsive somewhat reckless decision making with poor concentration.  He feels revved up even and is up in his  body at night.  He has been compliant with medications. No psychotic symptoms and no suicidal thoughts or homicidal thoughts. Discussed that the patient is having manic symptoms that are breaking through despite adequate lithium  level of 1.0 just a few days ago.  Therefore going up and lithium  is not a good option.  He is taking quetiapine  50 mg nightly.  Discussed the pros and cons of increasing quetiapine  to manage this acute mania versus adding Depakote which is the other more common mood stabilizer.  It is likely that increasing quetiapine  will work quicker because he will start sleeping better almost right away probably.  Discussed the dosing range of Seroquel  for mania is 300 to 800 mg nightly.  Discussed the side effects of Seroquel . Increase Seroquel  to 300 mg nightly and continue lithium . Schedule follow-up soon We will discussed the option of transitioning him to Depakote as an alternative to the lithium  at his appointment.  07/11/2020 appointment with the following noted:  Did the job with mood stab ilizing.  But was pretty hungover and reduced the dose to 150 mg HS and it seems fine.  Denies any manic sx and doesn't think wife sees him manic now.  Sleep is fine about 7-8 hours.   SE a bit harder getting up and going int he morning.  No clear triggers. Wife Keene thought his manic sx lasted about 4 weeks with volatility and difficulty functioning with work.  Hyperverbal and overinclusive and hypergraphia.  Noticed by others but resolved.  Couldn't stop himself.  Didn't really recognize mania until pointed out to him. Plan: OK increase Vyvanse  50 mg daily.   11/05/2020 appointment with the following noted: Doing well overall home and work and life fine.  Vyvanse  helpful.   Continue meds.  Tried microdosing mushrooms over 6 weeks and it seemed to help.  Helped him be a better person.   Plan: Needed increase Seroquel  to 300 mg HS for recent mania not prevented by lithium  .  But he says it's  too sedating so dropped to 150.  . not depressed after increase lithium  to 900 mg daily.   Continue Vyvanse  50 mg every morning  05/08/2021 appointment with the following noted: Never better as an adult. Taking low dose Seroquel  75 mg HS.  Sleeping well for the most part. No SE with meds. Satisfied with meds.  Benefit. Health has been good. Plan: For convenience we will reduce Seroquel  to 50 mg nightly.  And also for tolerability. . not depressed after increase lithium  to 900 mg daily.   Continue Vyvanse  50 mg every morning Check lithium  labs  11/06/2021 appointment with the following noted: Doing well.  Business growing fast.  Hanging in there well.  No mood swings nor depression. Sleeping good with quetiapine  150 mg HS Continues lithium  900 and Vyvanse  50 Plan: No med changes:  Continue Vyvanse  50 mg every morning Continue lithium  900 mg daily Continue quetiapine  150 mg nightly for treatment complicating insomnia and bipolar disorder Check lithium  level  05/12/2022 appointment noted: Meds as above with less quetiapine  100 mg HS with no hangover. Mood is good , happy and stable. Had work stress and insomnia early in month but resolved. Vyvanse  working well. Still some OCD tendencies.  A lot of time in spreadsheets and perfectionistic about things.  Perfectionistic.   Socks rolled in certain way and all arranged. Plan: No med changes: Reduce Vyvanse  40 mg every morning bc question overdriven and perfectionism with spreadsheets etc Continue lithium  900 mg daily Continue quetiapine  150 mg nightly for treatment complicating insomnia and bipolar disorder  12/16/22 appt noted; Trying to reduce Seroquel  to 100 mg HS . Sleep fine unless forgets it.   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 No sig mood swings.  Very stable. Patient reports stable mood and denies depressed or irritable moods.   Patient denies any recent difficulty with anxiety.  Patient denies difficulty with sleep initiation or maintenance. Denies appetite disturbance.  Patient reports that energy and motivation have been good.  Patient denies any difficulty with concentration.  Patient denies any suicidal ideation.  04/01/23 TC:  PT called and LM around 10:30am.  He reports that he has been very sick since last Thursday and unable to keep meds down. He hasn't had meds since then. He has lost weight due to illness too. He would like a call to help him restart his meds.    Con Dawna DASEN, [place]    04/01/23  1:34 PM Note Patient is at [facility] and wants a lithium  level and a CBC done. He ingested a drink last week that had THC in it (? Delta 8). He has been sick since, throwing up, can't keeps meds down.  He is asking how to get back on his meds. He sounds manic with rapid speech, says he knows what is wrong and it is a Dr. Geoffry issue and not a PCP issue.     MD resp:   04/01/23  6:50 PM Note I would like to see him if possible.  It is tricky to restart lithium  in context of NV.  See if he can come in about 1100 AM Friday 12.6.        04/02/23 appt noted: urgent seen with wife who is a nurse [employer] manic T'giving day family.  M colon CA and here with 20 people and was intense.  M in last year.  Experimenting with THC bevarages sold downtown.  To help with anxiety. Wife doesn't think it is going well. She and other family noticed he look impaired at family gathering.   After stopping THC he started vomiting.  No diarrhea.  Not sleeping for several days until last night. Increased Seroquel  and still not sleeping.  Not eating for a week.  Excessive thirst.  No vomiting since Monday.  Excessive thirst since THC.   Saw PCP yesterday and labs drawn and given Lunesta  last night.   Last THC delta 9 product was last Thursday.   She notices he's having more general anxiety and dealing with people.  Recognizes now had more anxiety.   Some social avoidance.   Business doing very well.   Missed 3-4 days of lithium .  Trying to catch up on lithium .  Taking  it 3-4 days in a row.   Meds: lithobid  450 BID, Vyvanse  40 AM, quetiapine  100 mg HS.  Lunesta  last night. No mania until THC. Plan: Better with reduced Vyvanse  40 mg every morning bc question overdriven and perfectionism with spreadsheets etc Continue lithium  900 mg daily BC mania increase quetiapine  300  mg nightly for treatment complicating insomnia and recent onset mania  04/29/22 appt noted: Can't slow down.  Still hyperactive and intense and mind racing fast and need to slow it down..   Current meds:  Seruquel 150-250 mg HS., lithium  CR 450 BID, Lunesta  3 mg hs prn. No SE Sleep ok with meds when takes meds. Euphoric mania with God-like feelings. Recognizing longstanding anxiety esp social.  May tend to overtalk and overexplain things which can be counter productive.    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 D doesn't drink bc pt has hx of alcohol dependence.  He's been sober for many years.  Attention problems.  Talked to wife.    Past psychiatric medication trials : include Geodon which caused sedation, Zyprexa which caused sedation, Latuda which caused akathisia and Seroquel  Lithium  excellent response at 900 mg daily Vyvanse  Found out Aunt has bipolar disabled.  Review of Systems:  Review of Systems  Respiratory:  Negative for shortness of breath.   Cardiovascular:  Negative for chest pain and palpitations.  Skin:  Positive for rash.  Neurological:  Negative for dizziness, tremors and light-headedness.  Psychiatric/Behavioral:  Positive for agitation, behavioral problems and sleep disturbance. Negative for confusion, decreased concentration, dysphoric mood, hallucinations, self-injury and suicidal ideas. The patient is nervous/anxious and is hyperactive.     Medications: I have reviewed the patient's current  medications.  Current Outpatient Medications  Medication Sig Dispense Refill   adalimumab  (HUMIRA , 2 PEN,) 40 MG/0.8ML PNKT pen Inject 1 pen injector subcutaneously every two weeks 2 each 5   atorvastatin  (LIPITOR) 20 MG tablet Take 1 tablet (20 mg total) by mouth daily. 90 tablet 4   Eszopiclone  3 MG TABS Take 1 tablet (3 mg total) by mouth at bedtime. Take immediately before bedtime 15 tablet 0   lisdexamfetamine (VYVANSE ) 40 MG capsule Take 1 capsule (40 mg total) by mouth daily. 30 capsule 0   lithium  carbonate (ESKALITH ) 450 MG ER tablet Take 1 tablet (450 mg total) by mouth 2 (two) times daily. 180 tablet 1   magic mouthwash (nystatin , lidocaine , diphenhydrAMINE, alum & mag hydroxide) suspension Swish and spit 10 mLs 3 (three) times daily for 5 days. 150 mL 0   silodosin  (RAPAFLO ) 8 MG CAPS capsule Take 1 capsule (8 mg total) by mouth at bedtime. 30 capsule 11   QUEtiapine  (SEROQUEL ) 100 MG tablet Take 4 tablets (400 mg total) by mouth at bedtime. 120 tablet 1   No current facility-administered medications for this visit.    Medication Side Effects: None  Allergies: No Known Allergies  Past Medical History:  Diagnosis Date   Bipolar disorder (HCC)    Depression    Psoriasis    Ulcer     Family History  Problem Relation Age of Onset   Hyperlipidemia Father    COPD Maternal Uncle     Social History   Socioeconomic History   Marital status: Married    Spouse name: Not on file   Number of children: Not on file   Years of education: Not on file   Highest education level: Not on file  Occupational History  Not on file  Tobacco Use   Smoking status: Never   Smokeless tobacco: Never  Substance and Sexual Activity   Alcohol use: Not on file   Drug use: Not on file   Sexual activity: Not on file  Other Topics Concern   Not on file  Social History Narrative   Not on file   Social Drivers of Health   Financial Resource Strain: Not on file  Food Insecurity: Not on  file  Transportation Needs: Not on file  Physical Activity: Not on file  Stress: Not on file  Social Connections: Not on file  Intimate Partner Violence: Not on file    Past Medical History, Surgical history, Social history, and Family history were reviewed and updated as appropriate.   Please see review of systems for further details on the patient's review from today.   Objective:   Physical Exam:  There were no vitals taken for this visit.  Physical Exam Constitutional:      General: He is not in acute distress.    Appearance: He is well-developed.  Musculoskeletal:        General: No deformity.  Neurological:     Mental Status: He is alert and oriented to person, place, and time.     Cranial Nerves: No dysarthria.     Coordination: Coordination normal.  Psychiatric:        Attention and Perception: Attention and perception normal.        Mood and Affect: Mood is anxious. Mood is not depressed. Affect is inappropriate. Affect is not angry.        Speech: Speech is rapid and pressured.        Behavior: Behavior is hyperactive. Behavior is not agitated or aggressive. Behavior is cooperative.        Thought Content: Thought content normal. Thought content is not paranoid or delusional. Thought content does not include homicidal or suicidal ideation. Thought content does not include homicidal or suicidal plan.        Cognition and Memory: Cognition and memory normal.        Judgment: Judgment normal.     Comments: Insight fair.  Some addiction px resurfacing Manic with pressured speech but directable and able to reason .  Looks a little better than last time. No psychosis. No sig anger     Lab Review:     Component Value Date/Time   NA 140 12/18/2022 1522   K 4.0 12/18/2022 1522   CL 103 12/18/2022 1522   CO2 26 12/18/2022 1522   GLUCOSE 83 12/18/2022 1522   BUN 9 12/18/2022 1522   CREATININE 1.41 (H) 12/18/2022 1522   CALCIUM  10.1 12/18/2022 1522   PROT 8.3  05/21/2011 1753   ALBUMIN 4.4 05/21/2011 1753   AST 48 (H) 05/21/2011 1753   ALT 37 05/21/2011 1753   ALKPHOS 87 05/21/2011 1753   BILITOT 2.5 (H) 05/21/2011 1753   GFRNONAA 79 (L) 05/21/2011 1753   GFRAA >90 05/21/2011 1753       Component Value Date/Time   WBC 19.2 (H) 05/21/2011 1753   RBC 4.91 05/21/2011 1753   HGB 16.7 05/21/2011 1753   HCT 46.1 05/21/2011 1753   PLT 216 05/21/2011 1753   MCV 93.9 05/21/2011 1753   MCH 34.0 05/21/2011 1753   MCHC 36.2 (H) 05/21/2011 1753   RDW 13.0 05/21/2011 1753   LYMPHSABS 0.8 05/21/2011 1753   MONOABS 1.2 (H) 05/21/2011 1753   EOSABS 0.0 05/21/2011 1753  BASOSABS 0.0 05/21/2011 1753   lithium  level October 21, 2017 0.4.  This was on 675 mg a day same his current dosage.  His level in November 18 was 1.10 900 mg a day.  The dosage was reduced because of elevated creatinine which in June was 1.53. Lithium  Lvl  Date Value Ref Range Status  12/18/2022 0.9 0.6 - 1.2 mmol/L Final   This was on 900 mg lithium  daily.  THS 2.54 12/23 Dr.K Cr 1.43 12/23 Pending FU PCP 11/15/21   No results found for: PHENYTOIN, PHENOBARB, VALPROATE, CBMZ   Adult self-report scale for ADD score was 29 on inattentive side which is high and 23 on the hyperactive scale which is borderline high suggestive of ADHD  .res Assessment: Plan:    Orenthal was seen today for follow-up, manic behavior and sleeping problem.  Diagnoses and all orders for this visit:  Attention deficit hyperactivity disorder (ADHD), combined type  Bipolar I disorder, most recent episode (or current) manic (HCC) -     QUEtiapine  (SEROQUEL ) 100 MG tablet; Take 4 tablets (400 mg total) by mouth at bedtime.  Lithium  use   30 min video face to face time with patient was spent on counseling and coordination of care. We discussed Bipolar disorder with a history of marked positive response to lithium  CR creep    Hx borderline creatinine. Counseled patient regarding potential  benefits, risks, and side effects of lithium  to include potential risk of lithium  affecting thyroid  and renal function.  Discussed need for periodic lab monitoring to determine drug level and to assess for potential adverse effects.  Counseled patient regarding signs and symptoms of lithium  toxicity and advised that they notify office immediately or seek urgent medical attention if experiencing these signs and symptoms.  Patient advised to contact office with any questions or concerns.   After stable repeat labs to RO diabetes insipidus vs SE thirst from meds.  12/15/2021 lithium  level 1.0 on 900 mg daily, creatinine 1.48, calcium  9.8 04/08/2022 urinalysis unremarkable  In past Needed increase Seroquel  to 300 mg HS for recent mania not prevented by lithium  in the past but has been stable on just 75 mg nightly as needed for sleep over the last several months..  He was too sedated on Seroquel  300 mg nightly before but needs higher dose until mania managed.  No euphoric mania ongoining.  Needs more Seroquel   . not depressed after increase lithium  to 900 mg daily.   Overall reports that during this last year his mood has been the best it has ever been, but is apparently dealing with anxiety causing social avoidance.  Productivity better and better on task with Vyvanse   PDMP is clear  Call if any worsening of mania.  He feels capable of recognizing the symptoms.  Better with reduced Vyvanse  40 mg every morning bc question overdriven and perfectionism with spreadsheets etc Continue lithium  900 mg daily  BC mania increase quetiapine  300-400 mg nightly for treatment complicating insomnia and recent onset mania Check lithium  level and labs pending.    FU  1-2 weeks  Lorene Macintosh, MD, DFAPA   Please see After Visit Summary for patient specific instructions.  Future Appointments  Date Time Provider Department Center  04/14/2024  8:30 AM McKenzie, Belvie CROME, MD [facility] None     No orders of  the defined types were placed in this encounter.     -------------------------------

## 2023-04-30 NOTE — Patient Instructions (Addendum)
 Increase Seroquel to 300-400 mg HS for mania

## 2023-05-06 ENCOUNTER — Other Ambulatory Visit (HOSPITAL_COMMUNITY): Payer: Self-pay

## 2023-05-14 ENCOUNTER — Telehealth: Payer: Self-pay | Admitting: Psychiatry

## 2023-05-14 ENCOUNTER — Encounter: Payer: Self-pay | Admitting: Psychiatry

## 2023-05-14 ENCOUNTER — Ambulatory Visit: Payer: Commercial Managed Care - PPO | Admitting: Psychiatry

## 2023-05-14 DIAGNOSIS — F902 Attention-deficit hyperactivity disorder, combined type: Secondary | ICD-10-CM

## 2023-05-14 DIAGNOSIS — R7989 Other specified abnormal findings of blood chemistry: Secondary | ICD-10-CM

## 2023-05-14 DIAGNOSIS — F3110 Bipolar disorder, current episode manic without psychotic features, unspecified: Secondary | ICD-10-CM | POA: Diagnosis not present

## 2023-05-14 DIAGNOSIS — Z79899 Other long term (current) drug therapy: Secondary | ICD-10-CM | POA: Diagnosis not present

## 2023-05-14 NOTE — Telephone Encounter (Signed)
Corey Reilly was seen in the office on today. He stated he forgot to inform Dr Jennelle Human that the Seroquel 300mg  gave him really bad headaches(pain in the back of his head). When Seroquel was reduced to 250mg  he didn't experience the headaches.

## 2023-05-14 NOTE — Progress Notes (Signed)
Corey Reilly [id number] January 06, 1961 63 y.o.  Subjective:   Patient ID:  Corey Reilly is a 63 y.o. (DOB 22-Jul-1960) male.  Chief Complaint:  Chief Complaint  Patient presents with   Follow-up   Manic Behavior   Medication Reaction    Depression        Associated symptoms include no decreased concentration and no suicidal ideas.  Corey Reilly presents to the office today for follow-up of bipolar disorder and concerns about focus and possible ADD.  When seen December 2019.  No meds were changed.  Labs were received with low normal lithium level.  Creatinine was slightly elevated at 1.4.  When seen in June.  No meds were changed.  Was recommended that he try NAC 600 mg daily for cognition.  Completed ADHD self questionnaire which was high on both the inattention and hyperactivity scales.  Tried NAC 1000 mg with less memory issues but not dramatic.  Does think it's improved but not enough and wanted to pursue a trial of stimulants.  This was discussed with his wife who noted the following: Periods of poor function intermittently.  Gets distracted and then forgets.  Cannot control it.  Wonders if it's ADD.  D dx ADHD and the 2 of them are similar and she is on Adderall.  At [employer].  Constantly distracted.  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  Therefore at the visit in August we initiated a trial of Vyvanse 30 mg daily. He called back September 3 asking for refills stating it was working well.  He did increase Vyvanse to 40 mg daily.  seen February 06, 2019.  Rec trial NAC 1000 mg for STM and continue Vyvanse 30 as best tolerated dose.  No other med changes.  Overall doing OK.  Expected stress with Covid and politics.  Mental health is OK. Doesn't feel the Vyvanse in any kick or mood but focus is definitely better.  Last month is best month ever and much mor productive and efficient.  Lost a little weight on it.  SE low appetite so skips on the weekend.  Less  distractible with Vyvanse and only uses work days.    seen May 09, 2019.  No meds were changed.  08/07/19 appt with the following noted: He increased the lithium dose back to 900 mg daily about first of April, because he was having some manic and depressive symptoms.  Lithium level requested was 0.6 at lower dosage of 675 mg daily.. Mood the last few days is OK.  Dealing with some pain in TMJ that is affecting him. 2 nights of no sleep but otherwise normal sleep. Has used more caffeine than usual but only in the morning.    Patient reports stable mood and denies depressed or irritable moods.  Patient denies any recent difficulty with anxiety.  Patient denies difficulty with sleep initiation or maintenance. Denies appetite disturbance.  Patient reports that energy and motivation have been good.  Patient denies any difficulty with concentration.  Patient denies any suicidal ideation. Plan: He is not manic and no longer depressed after increase lithium to 900 mg daily.  stable even with Vyvanse and doing well with lithium  12/06/19 appt with the following noted: All in all well.  Not sign manic.  Stayed on lithium 900.  Sleep good on 50 mg daily 7-8 hours.  No SE except  Not hungry on Vyvanse.  05/08/2020 appt noted: Mood OK overall.  But got worked up too much  last week for about 3 hours but it only lasted those hours. Some problems with sleep without apparent reason though occ forgets it. Sometimes a night with no sleep 1-2 times a month.  Usually good sleep routine. Melatonin and benadryl  Health is good. Plan: Get labs as soon as possible  06/14/2020 phone call with patient:  Patient called because of emerging mania. MD response: Return telephone call to patient: Had information indirectly from his wife that the patient is manic. Patient reports only 1 hour of sleep last night, impulsive somewhat reckless decision making with poor concentration.  He feels revved up even and is up in his  body at night.  He has been compliant with medications. No psychotic symptoms and no suicidal thoughts or homicidal thoughts. Discussed that the patient is having manic symptoms that are breaking through despite adequate lithium level of 1.0 just a few days ago.  Therefore going up and lithium is not a good option.  He is taking quetiapine 50 mg nightly.  Discussed the pros and cons of increasing quetiapine to manage this acute mania versus adding Depakote which is the other more common mood stabilizer.  It is likely that increasing quetiapine will work quicker because he will start sleeping better almost right away probably.  Discussed the dosing range of Seroquel for mania is 300 to 800 mg nightly.  Discussed the side effects of Seroquel. Increase Seroquel to 300 mg nightly and continue lithium. Schedule follow-up soon We will discussed the option of transitioning him to Depakote as an alternative to the lithium at his appointment.  07/11/2020 appointment with the following noted:  Did the job with mood stab ilizing.  But was pretty hungover and reduced the dose to 150 mg HS and it seems fine.  Denies any manic sx and doesn't think wife sees him manic now.  Sleep is fine about 7-8 hours.   SE a bit harder getting up and going int he morning.  No clear triggers. Wife Marjean Donna thought his manic sx lasted about 4 weeks with volatility and difficulty functioning with work.  Hyperverbal and overinclusive and hypergraphia.  Noticed by others but resolved.  Couldn't stop himself.  Didn't really recognize mania until pointed out to him. Plan: OK increase Vyvanse 50 mg daily.   11/05/2020 appointment with the following noted: Doing well overall home and work and life fine.  Vyvanse helpful.   Continue meds.  Tried microdosing mushrooms over 6 weeks and it seemed to help.  Helped him be a better person.   Plan: Needed increase Seroquel to 300 mg HS for recent mania not prevented by lithium .  But he says it's  too sedating so dropped to 150.  . not depressed after increase lithium to 900 mg daily.   Continue Vyvanse 50 mg every morning  05/08/2021 appointment with the following noted: Never better as an adult. Taking low dose Seroquel 75 mg HS.  Sleeping well for the most part. No SE with meds. Satisfied with meds.  Benefit. Health has been good. Plan: For convenience we will reduce Seroquel to 50 mg nightly.  And also for tolerability. . not depressed after increase lithium to 900 mg daily.   Continue Vyvanse 50 mg every morning Check lithium labs  11/06/2021 appointment with the following noted: Doing well.  Business growing fast.  Hanging in there well.  No mood swings nor depression. Sleeping good with quetiapine 150 mg HS Continues lithium 900 and Vyvanse 50 Plan: No med changes:  Continue Vyvanse 50 mg every morning Continue lithium 900 mg daily Continue quetiapine 150 mg nightly for treatment complicating insomnia and bipolar disorder Check lithium level  05/12/2022 appointment noted: Meds as above with less quetiapine 100 mg HS with no hangover. Mood is good , happy and stable. Had work stress and insomnia early in month but resolved. Vyvanse working well. Still some OCD tendencies.  A lot of time in spreadsheets and perfectionistic about things.  Perfectionistic.   Socks rolled in certain way and all arranged. Plan: No med changes: Reduce Vyvanse 40 mg every morning bc question overdriven and perfectionism with spreadsheets etc Continue lithium 900 mg daily Continue quetiapine 150 mg nightly for treatment complicating insomnia and bipolar disorder  12/16/22 appt noted; Trying to reduce Seroquel to 100 mg HS . Sleep fine unless forgets it.   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 No sig mood swings.  Very stable. Patient reports stable mood and denies depressed or irritable moods.   Patient denies any recent difficulty with anxiety.  Patient denies difficulty with sleep initiation or maintenance. Denies appetite disturbance.  Patient reports that energy and motivation have been good.  Patient denies any difficulty with concentration.  Patient denies any suicidal ideation.  04/01/23 TC:  PT called and LM around 10:30am.  He reports that he has been very sick since last Thursday and unable to keep meds down. He hasn't had meds since then. He has lost weight due to illness too. He would like a call to help him restart his meds.    Franchot Erichsen, [place]    04/01/23  1:34 PM Note Patient is at [facility] and wants a lithium level and a CBC done. He ingested a drink last week that had THC in it (? Delta 8). He has been sick since, throwing up, can't keeps meds down.  He is asking how to get back on his meds. He sounds manic with rapid speech, says he knows what is wrong and it is a Dr. Jennelle Human issue and not a PCP issue.     MD resp:   04/01/23  6:50 PM Note I would like to see him if possible.  It is tricky to restart lithium in context of NV.  See if he can come in about 1100 AM Friday 12.6.        04/02/23 appt noted: urgent seen with wife who is a nurse [employer] "manic" T'giving day family.  M colon CA and here with 20 people and was intense.  M in last year.  Experimenting with THC bevarages sold downtown.  To help with anxiety. Wife doesn't think it is going well. She and other family noticed he look impaired at family gathering.   After stopping THC he started vomiting.  No diarrhea.  Not sleeping for several days until last night. Increased Seroquel and still not sleeping.  Not eating for a week.  Excessive thirst.  No vomiting since Monday.  Excessive thirst since THC.   Saw PCP yesterday and labs drawn and given Lunesta last night.   Last THC delta 9 product was last Thursday.   She notices he's having more general anxiety and dealing with people.  Recognizes now had more anxiety.   Some social avoidance.   Business doing very well.   Missed 3-4 days of lithium.  Trying to catch up on lithium.  Taking  it 3-4 days in a row.   Meds: lithobid 450 BID, Vyvanse 40 AM, quetiapine 100 mg HS.  Lunesta last night. No mania until THC. Plan: Better with reduced Vyvanse 40 mg every morning bc question overdriven and perfectionism with spreadsheets etc Continue lithium 900 mg daily BC mania increase quetiapine 300  mg nightly for treatment complicating insomnia and recent onset mania  04/29/22 appt noted: Can't slow down.  Still hyperactive and intense and mind racing fast and need to slow it down..   Current meds:  Seruquel 150-250 mg HS., lithium CR 450 BID, Lunesta 3 mg hs prn. No SE Sleep ok with meds when takes meds. Euphoric mania with God-like feelings. Recognizing longstanding anxiety esp social.  May tend to [facility] and overexplain things which can be counter productive.   Plan: Better with reduced Vyvanse 40 mg every morning bc question overdriven and perfectionism with spreadsheets etc Continue lithium 900 mg daily BC mania increase quetiapine 300-400 mg nightly for treatment complicating insomnia and recent onset mania  05/14/23 appt noted: The way I feel about is it isn't mania.  Don't stop until 9 pm.  Unusual for me doing it for weeks. Plenty of sleep.   Wife thinks I'm doing well.  Not talking as fast.  Interactions more healthy.   Psych meds: seroquel 250, lithium Cr 450 BID. Compelled to do projects.     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 D doesn't drink bc pt has hx of alcohol dependence.  He's been sober for many years.  Attention problems.  Talked to wife.    Past psychiatric medication trials : include Geodon which caused sedation, Zyprexa which caused sedation, Latuda which caused akathisia and Seroquel Lithium excellent response at 900 mg daily Vyvanse Found out Aunt has bipolar disabled.  Review of Systems:  Review  of Systems  Respiratory:  Negative for shortness of breath.   Cardiovascular:  Negative for chest pain and palpitations.  Skin:  Positive for rash.  Neurological:  Negative for dizziness, tremors and light-headedness.  Psychiatric/Behavioral:  Negative for agitation, behavioral problems, confusion, decreased concentration, dysphoric mood, hallucinations, self-injury, sleep disturbance and suicidal ideas. The patient is nervous/anxious and is hyperactive.     Medications: I have reviewed the patient's current medications.  Current Outpatient Medications  Medication Sig Dispense Refill   adalimumab (HUMIRA, 2 PEN,) 40 MG/0.8ML PNKT pen Inject 1 pen injector subcutaneously every two weeks 2 each 5   atorvastatin (LIPITOR) 20 MG tablet Take 1 tablet (20 mg total) by mouth daily. 90 tablet 4   Eszopiclone 3 MG TABS Take 1 tablet (3 mg total) by mouth at bedtime. Take immediately before bedtime 15 tablet 0   lisdexamfetamine (VYVANSE) 40 MG capsule Take 1 capsule (40 mg total) by mouth daily. 30 capsule 0   lithium carbonate (ESKALITH) 450 MG ER tablet Take 1 tablet (450 mg total) by mouth 2 (two) times daily. 180 tablet 1   QUEtiapine (SEROQUEL) 100 MG tablet Take 4 tablets (400 mg total) by mouth at bedtime. (Patient taking differently: Take 250 mg by mouth at bedtime.) 120 tablet 1   silodosin (RAPAFLO) 8 MG CAPS capsule Take 1 capsule (8 mg total) by mouth at bedtime. 30 capsule 11   No current facility-administered medications for this visit.    Medication Side Effects: None  Allergies: No Known Allergies  Past Medical History:  Diagnosis Date   Bipolar disorder (HCC)    Depression  Psoriasis    Ulcer     Family History  Problem Relation Age of Onset   Hyperlipidemia Father    COPD Maternal Uncle     Social History   Socioeconomic History   Marital status: Married    Spouse name: Not on file   Number of children: Not on file   Years of education: Not on file   Highest  education level: Not on file  Occupational History   Not on file  Tobacco Use   Smoking status: Never   Smokeless tobacco: Never  Substance and Sexual Activity   Alcohol use: Not on file   Drug use: Not on file   Sexual activity: Not on file  Other Topics Concern   Not on file  Social History Narrative   Not on file   Social Drivers of Health   Financial Resource Strain: Not on file  Food Insecurity: Not on file  Transportation Needs: Not on file  Physical Activity: Not on file  Stress: Not on file  Social Connections: Not on file  Intimate Partner Violence: Not on file    Past Medical History, Surgical history, Social history, and Family history were reviewed and updated as appropriate.   Please see review of systems for further details on the patient's review from today.   Objective:   Physical Exam:  There were no vitals taken for this visit.  Physical Exam Constitutional:      General: He is not in acute distress.    Appearance: He is well-developed.  Musculoskeletal:        General: No deformity.  Neurological:     Mental Status: He is alert and oriented to person, place, and time.     Cranial Nerves: No dysarthria.     Coordination: Coordination normal.  Psychiatric:        Attention and Perception: Attention and perception normal.        Mood and Affect: Mood is not anxious or depressed. Affect is not angry or inappropriate.        Speech: Speech is not rapid and pressured.        Behavior: Behavior is hyperactive. Behavior is not agitated or aggressive. Behavior is cooperative.        Thought Content: Thought content normal. Thought content is not paranoid or delusional. Thought content does not include homicidal or suicidal ideation. Thought content does not include homicidal or suicidal plan.        Cognition and Memory: Cognition and memory normal.        Judgment: Judgment normal.     Comments: Insight fair.   Mania resolved.  Conversant  appropriately. No psychosis. No sig anger     Lab Review:     Component Value Date/Time   NA 140 12/18/2022 1522   K 4.0 12/18/2022 1522   CL 103 12/18/2022 1522   CO2 26 12/18/2022 1522   GLUCOSE 83 12/18/2022 1522   BUN 9 12/18/2022 1522   CREATININE 1.41 (H) 12/18/2022 1522   CALCIUM 10.1 12/18/2022 1522   PROT 8.3 05/21/2011 1753   ALBUMIN 4.4 05/21/2011 1753   AST 48 (H) 05/21/2011 1753   ALT 37 05/21/2011 1753   ALKPHOS 87 05/21/2011 1753   BILITOT 2.5 (H) 05/21/2011 1753   GFRNONAA 79 (L) 05/21/2011 1753   GFRAA >90 05/21/2011 1753       Component Value Date/Time   WBC 19.2 (H) 05/21/2011 1753   RBC 4.91 05/21/2011 1753   HGB  16.7 05/21/2011 1753   HCT 46.1 05/21/2011 1753   PLT 216 05/21/2011 1753   MCV 93.9 05/21/2011 1753   MCH 34.0 05/21/2011 1753   MCHC 36.2 (H) 05/21/2011 1753   RDW 13.0 05/21/2011 1753   LYMPHSABS 0.8 05/21/2011 1753   MONOABS 1.2 (H) 05/21/2011 1753   EOSABS 0.0 05/21/2011 1753   BASOSABS 0.0 05/21/2011 1753   lithium level October 21, 2017 0.4.  This was on 675 mg a day same his current dosage.  His level in November 18 was 1.10 900 mg a day.  The dosage was reduced because of elevated creatinine which in June was 1.53. Lithium Lvl  Date Value Ref Range Status  12/18/2022 0.9 0.6 - 1.2 mmol/L Final   This was on 900 mg lithium daily.  THS 2.54 12/23 Dr.K Cr 1.43 12/23 Pending FU PCP 11/15/21   No results found for: "PHENYTOIN", "PHENOBARB", "VALPROATE", "CBMZ"   Adult self-report scale for ADD score was 29 on inattentive side which is high and 23 on the hyperactive scale which is borderline high suggestive of ADHD  .res Assessment: Plan:    Bryshawn was seen today for follow-up, manic behavior and medication reaction.  Diagnoses and all orders for this visit:  Bipolar I disorder, most recent episode (or current) manic (HCC)  Attention deficit hyperactivity disorder (ADHD), combined type  Lithium use  Elevated serum  creatinine    30 min  face to face time with patient was spent on counseling and coordination of care. We discussed Bipolar disorder with a history of marked positive response to lithium CR creep  Recent mania triggered by not taking the medications in December due to illness.  Mania has resolved by resumption of medication with some residual hypomania which is not destructive or harmful.  He enjoys it because he is very productive.  He is sleeping adequately, having no conflicted interactions and no complaints from his wife. Discussed the risk of resumption of mania if he cuts the Seroquel dosage too quickly or too much as well as the potential for post manic depression but that risk appears low based on history.  He is tolerating the medications well.    Hx borderline creatinine. Counseled patient regarding potential benefits, risks, and side effects of lithium to include potential risk of lithium affecting thyroid and renal function.  Discussed need for periodic lab monitoring to determine drug level and to assess for potential adverse effects.  Counseled patient regarding signs and symptoms of lithium toxicity and advised that they notify office immediately or seek urgent medical attention if experiencing these signs and symptoms.  Patient advised to contact office with any questions or concerns.  03/2023 CR 1.41  After stable repeat labs to RO diabetes insipidus vs SE thirst from meds.  12/15/2021 lithium level 1.0 on 900 mg daily, creatinine 1.48, calcium 9.8 04/08/2022 and 12/20224 urinalysis unremarkable  In past Needed increase Seroquel to 300 mg HS for recent mania not prevented by lithium in the past but had been stable on just 75 mg nightly as needed for sleep over the last several months..   He was too sedated on Seroquel 300 mg nightly before but needs higher dose until mania managed. Don't cut back to quickly.  Productivity better and better on task with Vyvanse  PDMP is  clear  Call if any worsening of mania.  He feels capable of recognizing the symptoms.  Better with reduced Vyvanse 40 mg every morning bc question overdriven and perfectionism with  spreadsheets etc Continue lithium 900 mg daily  BC mania increased quetiapine 250 mg nightly for treatment complicating insomnia and recent onset mania.  It has worked.   Check lithium level   FU  3 mos  Meredith Staggers, MD, DFAPA   Please see After Visit Summary for patient specific instructions.  Future Appointments  Date Time Provider Department Center  08/12/2023 10:30 AM Cottle, Steva Ready., MD [facility] None  04/14/2024  8:30 AM McKenzie, Mardene Celeste, MD [facility] None     No orders of the defined types were placed in this encounter.     -------------------------------

## 2023-05-18 DIAGNOSIS — R051 Acute cough: Secondary | ICD-10-CM | POA: Diagnosis not present

## 2023-05-18 DIAGNOSIS — B338 Other specified viral diseases: Secondary | ICD-10-CM | POA: Diagnosis not present

## 2023-05-31 NOTE — Telephone Encounter (Signed)
Noted.  Good switch part of quetiapine to XR if needed.

## 2023-06-01 ENCOUNTER — Other Ambulatory Visit (HOSPITAL_COMMUNITY): Payer: Self-pay

## 2023-06-02 ENCOUNTER — Other Ambulatory Visit: Payer: Self-pay

## 2023-06-02 ENCOUNTER — Other Ambulatory Visit: Payer: Self-pay | Admitting: Medical Genetics

## 2023-06-02 ENCOUNTER — Other Ambulatory Visit (HOSPITAL_COMMUNITY): Payer: Self-pay

## 2023-06-02 MED ORDER — ATORVASTATIN CALCIUM 20 MG PO TABS
20.0000 mg | ORAL_TABLET | Freq: Every day | ORAL | 4 refills | Status: AC
  Filled 2023-06-02: qty 90, 90d supply, fill #0
  Filled 2023-09-04: qty 90, 90d supply, fill #1
  Filled 2023-12-03: qty 90, 90d supply, fill #2
  Filled 2024-03-20: qty 90, 90d supply, fill #3

## 2023-06-12 ENCOUNTER — Other Ambulatory Visit (HOSPITAL_COMMUNITY): Payer: Self-pay

## 2023-06-14 ENCOUNTER — Other Ambulatory Visit: Payer: Self-pay

## 2023-06-15 DIAGNOSIS — Z800 Family history of malignant neoplasm of digestive organs: Secondary | ICD-10-CM | POA: Diagnosis not present

## 2023-06-15 DIAGNOSIS — R112 Nausea with vomiting, unspecified: Secondary | ICD-10-CM | POA: Diagnosis not present

## 2023-06-15 DIAGNOSIS — R09A2 Foreign body sensation, throat: Secondary | ICD-10-CM | POA: Diagnosis not present

## 2023-06-29 ENCOUNTER — Other Ambulatory Visit (HOSPITAL_COMMUNITY): Payer: Self-pay

## 2023-06-29 ENCOUNTER — Other Ambulatory Visit: Payer: Self-pay | Admitting: Psychiatry

## 2023-06-29 DIAGNOSIS — F3110 Bipolar disorder, current episode manic without psychotic features, unspecified: Secondary | ICD-10-CM

## 2023-06-29 MED ORDER — QUETIAPINE FUMARATE 100 MG PO TABS
300.0000 mg | ORAL_TABLET | Freq: Every day | ORAL | 0 refills | Status: DC
  Filled 2023-06-29: qty 90, 30d supply, fill #0

## 2023-07-01 ENCOUNTER — Other Ambulatory Visit: Payer: Self-pay | Admitting: Psychiatry

## 2023-07-01 ENCOUNTER — Other Ambulatory Visit (HOSPITAL_COMMUNITY): Payer: Self-pay

## 2023-07-01 DIAGNOSIS — F3110 Bipolar disorder, current episode manic without psychotic features, unspecified: Secondary | ICD-10-CM

## 2023-07-01 DIAGNOSIS — F902 Attention-deficit hyperactivity disorder, combined type: Secondary | ICD-10-CM

## 2023-07-01 MED ORDER — LISDEXAMFETAMINE DIMESYLATE 40 MG PO CAPS
40.0000 mg | ORAL_CAPSULE | Freq: Every day | ORAL | 0 refills | Status: DC
  Filled 2023-07-01: qty 30, 30d supply, fill #0

## 2023-07-01 MED ORDER — LITHIUM CARBONATE ER 450 MG PO TBCR
450.0000 mg | EXTENDED_RELEASE_TABLET | Freq: Two times a day (BID) | ORAL | 1 refills | Status: DC
  Filled 2023-07-01: qty 180, 90d supply, fill #0
  Filled 2023-09-27: qty 180, 90d supply, fill #1

## 2023-08-02 ENCOUNTER — Other Ambulatory Visit (HOSPITAL_COMMUNITY): Payer: Self-pay

## 2023-08-04 ENCOUNTER — Other Ambulatory Visit: Payer: Self-pay | Admitting: Psychiatry

## 2023-08-04 ENCOUNTER — Other Ambulatory Visit (HOSPITAL_COMMUNITY): Payer: Self-pay

## 2023-08-04 DIAGNOSIS — F902 Attention-deficit hyperactivity disorder, combined type: Secondary | ICD-10-CM

## 2023-08-04 MED ORDER — LISDEXAMFETAMINE DIMESYLATE 40 MG PO CAPS
40.0000 mg | ORAL_CAPSULE | Freq: Every day | ORAL | 0 refills | Status: DC
Start: 2023-08-04 — End: 2023-09-07
  Filled 2023-08-04: qty 30, 30d supply, fill #0

## 2023-08-05 ENCOUNTER — Other Ambulatory Visit (HOSPITAL_COMMUNITY): Payer: Self-pay

## 2023-08-06 ENCOUNTER — Other Ambulatory Visit: Payer: Self-pay

## 2023-08-06 DIAGNOSIS — Z006 Encounter for examination for normal comparison and control in clinical research program: Secondary | ICD-10-CM

## 2023-08-12 ENCOUNTER — Ambulatory Visit: Payer: Commercial Managed Care - PPO | Admitting: Psychiatry

## 2023-08-15 LAB — GENECONNECT MOLECULAR SCREEN: Genetic Analysis Overall Interpretation: NEGATIVE

## 2023-08-29 ENCOUNTER — Other Ambulatory Visit: Payer: Self-pay | Admitting: Psychiatry

## 2023-08-29 ENCOUNTER — Other Ambulatory Visit (HOSPITAL_COMMUNITY): Payer: Self-pay

## 2023-08-29 DIAGNOSIS — F902 Attention-deficit hyperactivity disorder, combined type: Secondary | ICD-10-CM

## 2023-08-30 ENCOUNTER — Other Ambulatory Visit: Payer: Self-pay

## 2023-08-30 ENCOUNTER — Other Ambulatory Visit (HOSPITAL_COMMUNITY): Payer: Self-pay

## 2023-09-04 ENCOUNTER — Other Ambulatory Visit: Payer: Self-pay | Admitting: Psychiatry

## 2023-09-04 DIAGNOSIS — F3110 Bipolar disorder, current episode manic without psychotic features, unspecified: Secondary | ICD-10-CM

## 2023-09-05 ENCOUNTER — Other Ambulatory Visit (HOSPITAL_COMMUNITY): Payer: Self-pay

## 2023-09-05 MED ORDER — QUETIAPINE FUMARATE 100 MG PO TABS
300.0000 mg | ORAL_TABLET | Freq: Every day | ORAL | 0 refills | Status: DC
  Filled 2023-09-05: qty 90, 30d supply, fill #0

## 2023-09-06 ENCOUNTER — Other Ambulatory Visit (HOSPITAL_COMMUNITY): Payer: Self-pay

## 2023-09-07 ENCOUNTER — Other Ambulatory Visit: Payer: Self-pay | Admitting: Psychiatry

## 2023-09-07 DIAGNOSIS — F902 Attention-deficit hyperactivity disorder, combined type: Secondary | ICD-10-CM

## 2023-09-08 ENCOUNTER — Other Ambulatory Visit (HOSPITAL_COMMUNITY): Payer: Self-pay

## 2023-09-08 MED ORDER — LISDEXAMFETAMINE DIMESYLATE 40 MG PO CAPS
40.0000 mg | ORAL_CAPSULE | Freq: Every day | ORAL | 0 refills | Status: DC
  Filled 2023-09-08: qty 30, 30d supply, fill #0

## 2023-09-09 ENCOUNTER — Other Ambulatory Visit: Payer: Self-pay

## 2023-09-28 ENCOUNTER — Other Ambulatory Visit: Payer: Self-pay

## 2023-09-30 ENCOUNTER — Other Ambulatory Visit (HOSPITAL_COMMUNITY): Payer: Self-pay

## 2023-09-30 ENCOUNTER — Ambulatory Visit: Admitting: Psychiatry

## 2023-09-30 ENCOUNTER — Encounter: Payer: Self-pay | Admitting: Psychiatry

## 2023-09-30 DIAGNOSIS — R7989 Other specified abnormal findings of blood chemistry: Secondary | ICD-10-CM | POA: Diagnosis not present

## 2023-09-30 DIAGNOSIS — F3110 Bipolar disorder, current episode manic without psychotic features, unspecified: Secondary | ICD-10-CM

## 2023-09-30 DIAGNOSIS — Z79899 Other long term (current) drug therapy: Secondary | ICD-10-CM

## 2023-09-30 DIAGNOSIS — F902 Attention-deficit hyperactivity disorder, combined type: Secondary | ICD-10-CM

## 2023-09-30 MED ORDER — LITHIUM CARBONATE ER 450 MG PO TBCR
450.0000 mg | EXTENDED_RELEASE_TABLET | Freq: Two times a day (BID) | ORAL | 1 refills | Status: AC
  Filled 2023-09-30 – 2024-01-04 (×2): qty 180, 90d supply, fill #0
  Filled 2024-04-02: qty 180, 90d supply, fill #1
  Filled 2024-04-03: qty 150, 75d supply, fill #1

## 2023-09-30 MED ORDER — LISDEXAMFETAMINE DIMESYLATE 40 MG PO CAPS
40.0000 mg | ORAL_CAPSULE | Freq: Every day | ORAL | 0 refills | Status: DC
  Filled 2023-09-30 – 2023-10-12 (×2): qty 90, 90d supply, fill #0

## 2023-09-30 NOTE — Progress Notes (Signed)
 ZEEK ROSTRON [id number] June 11, 1960 63 y.o.  Subjective:   Patient ID:  Corey Reilly is a 63 y.o. (DOB May 25, 1960) male.  Chief Complaint:  Chief Complaint  Patient presents with   Follow-up   Manic Behavior    RIGLEY NIESS presents to the office today for follow-up of bipolar disorder and concerns about focus and possible ADD.  When seen December 2019.  No meds were changed.  Labs were received with low normal lithium  level.  Creatinine was slightly elevated at 1.4.  When seen in June.  No meds were changed.  Was recommended that he try NAC 600 mg daily for cognition.  Completed ADHD self questionnaire which was high on both the inattention and hyperactivity scales.  Tried NAC 1000 mg with less memory issues but not dramatic.  Does think it's improved but not enough and wanted to pursue a trial of stimulants.  This was discussed with his wife who noted the following: Periods of poor function intermittently.  Gets distracted and then forgets.  Cannot control it.  Wonders if it's ADD.  D dx ADHD and the 2 of them are similar and she is on Adderall.  At [employer].  Constantly distracted.  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  Therefore at the visit in August we initiated a trial of Vyvanse  30 mg daily. He called back September 3 asking for refills stating it was working well.  He did increase Vyvanse  to 40 mg daily.  seen February 06, 2019.  Rec trial NAC 1000 mg for STM and continue Vyvanse  30 as best tolerated dose.  No other med changes.  Overall doing OK.  Expected stress with Covid and politics.  Mental health is OK. Doesn't feel the Vyvanse  in any kick or mood but focus is definitely better.  Last month is best month ever and much mor productive and efficient.  Lost a little weight on it.  SE low appetite so skips on the weekend.  Less distractible with Vyvanse  and only uses work days.    seen May 09, 2019.  No meds were changed.  08/07/19 appt with the  following noted: He increased the lithium  dose back to 900 mg daily about first of April, because he was having some manic and depressive symptoms.  Lithium  level requested was 0.6 at lower dosage of 675 mg daily.. Mood the last few days is OK.  Dealing with some pain in TMJ that is affecting him. 2 nights of no sleep but otherwise normal sleep. Has used more caffeine than usual but only in the morning.    Patient reports stable mood and denies depressed or irritable moods.  Patient denies any recent difficulty with anxiety.  Patient denies difficulty with sleep initiation or maintenance. Denies appetite disturbance.  Patient reports that energy and motivation have been good.  Patient denies any difficulty with concentration.  Patient denies any suicidal ideation. Plan: He is not manic and no longer depressed after increase lithium  to 900 mg daily.  stable even with Vyvanse  and doing well with lithium   12/06/19 appt with the following noted: All in all well.  Not sign manic.  Stayed on lithium  900.  Sleep good on 50 mg daily 7-8 hours.  No SE except  Not hungry on Vyvanse .  05/08/2020 appt noted: Mood OK overall.  But got worked up too much last week for about 3 hours but it only lasted those hours. Some problems with sleep without apparent reason though occ forgets it.  Sometimes a night with no sleep 1-2 times a month.  Usually good sleep routine. Melatonin and benadryl  Health is good. Plan: Get labs as soon as possible  06/14/2020 phone call with patient:  Patient called because of emerging mania. MD response: Return telephone call to patient: Had information indirectly from his wife that the patient is manic. Patient reports only 1 hour of sleep last night, impulsive somewhat reckless decision making with poor concentration.  He feels revved up even and is up in his body at night.  He has been compliant with medications. No psychotic symptoms and no suicidal thoughts or homicidal  thoughts. Discussed that the patient is having manic symptoms that are breaking through despite adequate lithium  level of 1.0 just a few days ago.  Therefore going up and lithium  is not a good option.  He is taking quetiapine  50 mg nightly.  Discussed the pros and cons of increasing quetiapine  to manage this acute mania versus adding Depakote which is the other more common mood stabilizer.  It is likely that increasing quetiapine  will work quicker because he will start sleeping better almost right away probably.  Discussed the dosing range of Seroquel  for mania is 300 to 800 mg nightly.  Discussed the side effects of Seroquel . Increase Seroquel  to 300 mg nightly and continue lithium . Schedule follow-up soon We will discussed the option of transitioning him to Depakote as an alternative to the lithium  at his appointment.  07/11/2020 appointment with the following noted:  Did the job with mood stab ilizing.  But was pretty hungover and reduced the dose to 150 mg HS and it seems fine.  Denies any manic sx and doesn't think wife sees him manic now.  Sleep is fine about 7-8 hours.   SE a bit harder getting up and going int he morning.  No clear triggers. Wife Gareld June thought his manic sx lasted about 4 weeks with volatility and difficulty functioning with work.  Hyperverbal and overinclusive and hypergraphia.  Noticed by others but resolved.  Couldn't stop himself.  Didn't really recognize mania until pointed out to him. Plan: OK increase Vyvanse  50 mg daily.   11/05/2020 appointment with the following noted: Doing well overall home and work and life fine.  Vyvanse  helpful.   Continue meds.  Tried microdosing mushrooms over 6 weeks and it seemed to help.  Helped him be a better person.   Plan: Needed increase Seroquel  to 300 mg HS for recent mania not prevented by lithium  .  But he says it's too sedating so dropped to 150.  . not depressed after increase lithium  to 900 mg daily.   Continue Vyvanse  50 mg  every morning  05/08/2021 appointment with the following noted: Never better as an adult. Taking low dose Seroquel  75 mg HS.  Sleeping well for the most part. No SE with meds. Satisfied with meds.  Benefit. Health has been good. Plan: For convenience we will reduce Seroquel  to 50 mg nightly.  And also for tolerability. . not depressed after increase lithium  to 900 mg daily.   Continue Vyvanse  50 mg every morning Check lithium  labs  11/06/2021 appointment with the following noted: Doing well.  Business growing fast.  Hanging in there well.  No mood swings nor depression. Sleeping good with quetiapine  150 mg HS Continues lithium  900 and Vyvanse  50 Plan: No med changes: Continue Vyvanse  50 mg every morning Continue lithium  900 mg daily Continue quetiapine  150 mg nightly for treatment complicating insomnia and bipolar disorder  Check lithium  level  05/12/2022 appointment noted: Meds as above with less quetiapine  100 mg HS with no hangover. Mood is good , happy and stable. Had work stress and insomnia early in month but resolved. Vyvanse  working well. Still some OCD tendencies.  A lot of time in spreadsheets and perfectionistic about things.  Perfectionistic.   Socks rolled in certain way and all arranged. Plan: No med changes: Reduce Vyvanse  40 mg every morning bc question overdriven and perfectionism with spreadsheets etc Continue lithium  900 mg daily Continue quetiapine  150 mg nightly for treatment complicating insomnia and bipolar disorder  12/16/22 appt noted; Trying to reduce Seroquel  to 100 mg HS . Sleep fine unless forgets it.   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 No sig mood swings.  Very stable. Patient reports stable mood and denies depressed or irritable moods.  Patient denies any recent difficulty with anxiety.  Patient denies difficulty with sleep initiation or maintenance.  Denies appetite disturbance.  Patient reports that energy and motivation have been good.  Patient denies any difficulty with concentration.  Patient denies any suicidal ideation.  04/01/23 TC:  PT called and LM around 10:30am.  He reports that he has been very sick since last Thursday and unable to keep meds down. He hasn't had meds since then. He has lost weight due to illness too. He would like a call to help him restart his meds.    Matthew Songster, [place]    04/01/23  1:34 PM Note Patient is at [facility] and wants a lithium  level and a CBC done. He ingested a drink last week that had THC in it (? Delta 8). He has been sick since, throwing up, can't keeps meds down.  He is asking how to get back on his meds. He sounds manic with rapid speech, says he knows what is wrong and it is a Dr. Toi Foster issue and not a PCP issue.     MD resp:   04/01/23  6:50 PM Note I would like to see him if possible.  It is tricky to restart lithium  in context of NV.  See if he can come in about 1100 AM Friday 12.6.        04/02/23 appt noted: urgent seen with wife who is a nurse [employer] "manic" T'giving day family.  M colon CA and here with 20 people and was intense.  M in last year.  Experimenting with THC bevarages sold downtown.  To help with anxiety. Wife doesn't think it is going well. She and other family noticed he look impaired at family gathering.   After stopping THC he started vomiting.  No diarrhea.  Not sleeping for several days until last night. Increased Seroquel  and still not sleeping.  Not eating for a week.  Excessive thirst.  No vomiting since Monday.  Excessive thirst since THC.   Saw PCP yesterday and labs drawn and given Lunesta  last night.   Last THC delta 9 product was last Thursday.   She notices he's having more general anxiety and dealing with people.  Recognizes now had more anxiety.  Some social avoidance.   Business doing very well.   Missed 3-4 days of lithium .  Trying to catch up on lithium .   Taking it 3-4 days in a row.   Meds: lithobid  450 BID, Vyvanse  40 AM, quetiapine  100 mg HS.  Lunesta  last night.  No mania until THC. Plan: Better with reduced Vyvanse  40 mg every morning bc question overdriven and perfectionism with spreadsheets etc Continue lithium  900 mg daily BC mania increase quetiapine  300  mg nightly for treatment complicating insomnia and recent onset mania  04/29/22 appt noted: Can't slow down.  Still hyperactive and intense and mind racing fast and need to slow it down..   Current meds:  Seruquel 150-250 mg HS., lithium  CR 450 BID, Lunesta  3 mg hs prn. No SE Sleep ok with meds when takes meds. Euphoric mania with God-like feelings. Recognizing longstanding anxiety esp social.  May tend to overtalk and overexplain things which can be counter productive.   Plan: Better with reduced Vyvanse  40 mg every morning bc question overdriven and perfectionism with spreadsheets etc Continue lithium  900 mg daily BC mania increase quetiapine  300-400 mg nightly for treatment complicating insomnia and recent onset mania  05/14/23 appt noted: The way I feel about is it isn't mania.  Don't stop until 9 pm.  Unusual for me doing it for weeks. Plenty of sleep.   Wife thinks I'm doing well.  Not talking as fast.  Interactions more healthy.   Psych meds: seroquel  250, lithium  Cr 450 BID. Compelled to do projects.    the Seroquel  300mg  gave him really bad headaches(pain in the back of his head). When Seroquel  was reduced to 250mg  he didn't experience the headaches.  09/30/23 appt noted:  Psych meds: seroquel  200, lithium  Cr 450 BID, Vyvanse  40 Inordinate stress more than ever but managing it.   Reduced Seroquel  to 200 to be on LED.  And bc it's a littl hard to get up in the morning. Wife good with him.  She keeps a focus on whether he's manic and she has no concerns.  Sleep 8 hours.   Satisfied with meds.   M passed away a few weeks ago.   From [place].  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   Other D is smart too. D doesn't drink bc pt has hx of alcohol dependence.  He's been sober for many years.  Attention problems.  Talked to wife.    Past psychiatric medication trials : include Geodon which caused sedation, Zyprexa which caused sedation, Latuda which caused akathisia and Seroquel  Lithium  excellent response at 900 mg daily Vyvanse  Found out Aunt has bipolar disabled.  Review of Systems:  Review of Systems  Respiratory:  Negative for shortness of breath.   Cardiovascular:  Negative for chest pain and palpitations.  Skin:  Positive for rash.  Neurological:  Negative for dizziness, tremors, weakness and light-headedness.  Psychiatric/Behavioral:  Negative for agitation, behavioral problems, confusion, decreased concentration, dysphoric mood, hallucinations, self-injury, sleep disturbance and suicidal ideas. The patient is nervous/anxious and is hyperactive.     Medications: I have reviewed the patient's current medications.  Current Outpatient Medications  Medication Sig Dispense Refill   adalimumab  (HUMIRA , 2 PEN,) 40 MG/0.8ML AJKT pen Inject 1 pen injector subcutaneously every two weeks 2 each 5   atorvastatin  (LIPITOR) 20 MG tablet Take 1 tablet (20 mg total) by mouth daily. 90 tablet 4   Eszopiclone  3 MG TABS Take 1 tablet (3 mg total) by mouth at bedtime. Take immediately before bedtime 15 tablet 0   QUEtiapine  (SEROQUEL ) 100 MG tablet Take 3 tablets (300 mg total) by mouth at bedtime. (Patient taking differently: Take 200 mg by mouth at bedtime.) 90 tablet 0   silodosin  (RAPAFLO ) 8 MG  CAPS capsule Take 1 capsule (8 mg total) by mouth at bedtime. 30 capsule 11   lisdexamfetamine (VYVANSE ) 40 MG capsule Take 1 capsule (40 mg total) by mouth daily. 90 capsule 0   lithium  carbonate (ESKALITH ) 450 MG ER tablet Take 1 tablet (450 mg total) by mouth 2 (two) times daily. 180 tablet 1   No current facility-administered medications  for this visit.    Medication Side Effects: None  Allergies: No Known Allergies  Past Medical History:  Diagnosis Date   Bipolar disorder (HCC)    Depression    Psoriasis    Ulcer     Family History  Problem Relation Age of Onset   Hyperlipidemia Father    COPD Maternal Uncle     Social History   Socioeconomic History   Marital status: Married    Spouse name: Not on file   Number of children: Not on file   Years of education: Not on file   Highest education level: Not on file  Occupational History   Not on file  Tobacco Use   Smoking status: Never   Smokeless tobacco: Never  Substance and Sexual Activity   Alcohol use: Not on file   Drug use: Not on file   Sexual activity: Not on file  Other Topics Concern   Not on file  Social History Narrative   Not on file   Social Drivers of Health   Financial Resource Strain: Not on file  Food Insecurity: Not on file  Transportation Needs: Not on file  Physical Activity: Not on file  Stress: Not on file  Social Connections: Not on file  Intimate Partner Violence: Not on file    Past Medical History, Surgical history, Social history, and Family history were reviewed and updated as appropriate.   Please see review of systems for further details on the patient's review from today.   Objective:   Physical Exam:  There were no vitals taken for this visit.  Physical Exam Constitutional:      General: He is not in acute distress.    Appearance: He is well-developed.  Musculoskeletal:        General: No deformity.  Neurological:     Mental Status: He is alert and oriented to person, place, and time.     Cranial Nerves: No dysarthria.     Coordination: Coordination normal.  Psychiatric:        Attention and Perception: Attention and perception normal.        Mood and Affect: Mood is not anxious or depressed. Affect is not angry or inappropriate.        Speech: Speech is not rapid and pressured.        Behavior:  Behavior is hyperactive. Behavior is not agitated or aggressive. Behavior is cooperative.        Thought Content: Thought content normal. Thought content is not paranoid or delusional. Thought content does not include homicidal or suicidal ideation. Thought content does not include homicidal or suicidal plan.        Cognition and Memory: Cognition and memory normal.        Judgment: Judgment normal.     Comments: Insight fair.   Mania resolved.  Conversant appropriately.  Appropriate.   No psychosis. No sig anger     Lab Review:     Component Value Date/Time   NA 140 12/18/2022 1522   K 4.0 12/18/2022 1522   CL 103 12/18/2022 1522   CO2 26 12/18/2022  1522   GLUCOSE 83 12/18/2022 1522   BUN 9 12/18/2022 1522   CREATININE 1.41 (H) 12/18/2022 1522   CALCIUM  10.1 12/18/2022 1522   PROT 8.3 05/21/2011 1753   ALBUMIN 4.4 05/21/2011 1753   AST 48 (H) 05/21/2011 1753   ALT 37 05/21/2011 1753   ALKPHOS 87 05/21/2011 1753   BILITOT 2.5 (H) 05/21/2011 1753   GFRNONAA 79 (L) 05/21/2011 1753   GFRAA >90 05/21/2011 1753       Component Value Date/Time   WBC 19.2 (H) 05/21/2011 1753   RBC 4.91 05/21/2011 1753   HGB 16.7 05/21/2011 1753   HCT 46.1 05/21/2011 1753   PLT 216 05/21/2011 1753   MCV 93.9 05/21/2011 1753   MCH 34.0 05/21/2011 1753   MCHC 36.2 (H) 05/21/2011 1753   RDW 13.0 05/21/2011 1753   LYMPHSABS 0.8 05/21/2011 1753   MONOABS 1.2 (H) 05/21/2011 1753   EOSABS 0.0 05/21/2011 1753   BASOSABS 0.0 05/21/2011 1753   lithium  level October 21, 2017 0.4.  This was on 675 mg a day same his current dosage.  His level in November 18 was 1.10 900 mg a day.  The dosage was reduced because of elevated creatinine which in June was 1.53. Lithium  Lvl  Date Value Ref Range Status  12/18/2022 0.9 0.6 - 1.2 mmol/L Final   This was on 900 mg lithium  daily.  THS 2.54 12/23 Dr.K Cr 1.43 12/23 Pending FU PCP 11/15/21   No results found for: "PHENYTOIN", "PHENOBARB", "VALPROATE", "CBMZ"    Adult self-report scale for ADD score was 29 on inattentive side which is high and 23 on the hyperactive scale which is borderline high suggestive of ADHD  .res Assessment: Plan:    Thomson "Des" was seen today for follow-up and manic behavior.  Diagnoses and all orders for this visit:  Bipolar I disorder, most recent episode (or current) manic (HCC) -     lithium  carbonate (ESKALITH ) 450 MG ER tablet; Take 1 tablet (450 mg total) by mouth 2 (two) times daily. -     Lithium  level -     Basic metabolic panel  Attention deficit hyperactivity disorder (ADHD), combined type -     lisdexamfetamine (VYVANSE ) 40 MG capsule; Take 1 capsule (40 mg total) by mouth daily.  Lithium  use -     Basic metabolic panel  Elevated serum creatinine     30 min  face to face time with patient . We discussed Bipolar disorder with a history of marked positive response to lithium  CR creep  Recent mania triggered by not taking the medications in December 2024 due to illness.  Mania has resolved by resumption of medication .  He enjoys it because he is very productive.   Euthymic now    Hx borderline creatinine. Counseled patient regarding potential benefits, risks, and side effects of lithium  to include potential risk of lithium  affecting thyroid  and renal function.  Discussed need for periodic lab monitoring to determine drug level and to assess for potential adverse effects.  Counseled patient regarding signs and symptoms of lithium  toxicity and advised that they notify office immediately or seek urgent medical attention if experiencing these signs and symptoms.  Patient advised to contact office with any questions or concerns.  03/2023 CR 1.41  After stable repeat labs to RO diabetes insipidus vs SE thirst from meds.  12/15/2021 lithium  level 1.0 on 900 mg daily, creatinine 1.48, calcium  9.8 04/08/2022 and 12/20224 urinalysis unremarkable  In past Needed increase Seroquel   to 300 mg HS for recent  mania not prevented by lithium  in the past but had been stable on just 75 mg nightly as needed for sleep over the last several months..   He was too sedated on Seroquel  300 mg nightly   Productivity better and better on task with Vyvanse   PDMP is clear  Call if any worsening of mania.  He feels capable of recognizing the symptoms.  Better with reduced Vyvanse  40 mg every morning bc question overdriven and perfectionism with spreadsheets etc Continue lithium  900 mg daily  He cut back to quetiapine  200 mg nightly for treatment complicating insomnia and is doing fine.  It has worked.   Check lithium  level & BMP now  FU  6 mos bc more stable.  Nori Beat, MD, DFAPA   Please see After Visit Summary for patient specific instructions.  Future Appointments  Date Time Provider Department Center  11/05/2023  8:30 AM McKenzie, Arden Beck, MD [facility] None  04/14/2024  8:30 AM McKenzie, Arden Beck, MD [facility] None     Orders Placed This Encounter  Procedures   Lithium  level   Basic metabolic panel      -------------------------------

## 2023-10-04 DIAGNOSIS — Z79899 Other long term (current) drug therapy: Secondary | ICD-10-CM | POA: Diagnosis not present

## 2023-10-04 DIAGNOSIS — F3110 Bipolar disorder, current episode manic without psychotic features, unspecified: Secondary | ICD-10-CM | POA: Diagnosis not present

## 2023-10-05 ENCOUNTER — Ambulatory Visit: Payer: Self-pay | Admitting: Psychiatry

## 2023-10-05 LAB — BASIC METABOLIC PANEL WITH GFR
BUN/Creatinine Ratio: 10 (calc) (ref 6–22)
BUN: 14 mg/dL (ref 7–25)
CO2: 26 mmol/L (ref 20–32)
Calcium: 9.3 mg/dL (ref 8.6–10.3)
Chloride: 104 mmol/L (ref 98–110)
Creat: 1.47 mg/dL — ABNORMAL HIGH (ref 0.70–1.35)
Glucose, Bld: 81 mg/dL (ref 65–99)
Potassium: 3.8 mmol/L (ref 3.5–5.3)
Sodium: 141 mmol/L (ref 135–146)
eGFR: 54 mL/min/{1.73_m2} — ABNORMAL LOW (ref >=60)

## 2023-10-05 LAB — LITHIUM LEVEL: Lithium Lvl: 0.8 mmol/L (ref 0.6–1.2)

## 2023-10-05 NOTE — Progress Notes (Signed)
 Cr stable 1.47 over 4 years. Lithium  stable 0.8  Good .  No changes.

## 2023-10-12 ENCOUNTER — Other Ambulatory Visit (HOSPITAL_COMMUNITY): Payer: Self-pay

## 2023-11-02 ENCOUNTER — Other Ambulatory Visit (HOSPITAL_COMMUNITY): Payer: Self-pay

## 2023-11-03 DIAGNOSIS — Z85828 Personal history of other malignant neoplasm of skin: Secondary | ICD-10-CM | POA: Diagnosis not present

## 2023-11-03 DIAGNOSIS — Z79899 Other long term (current) drug therapy: Secondary | ICD-10-CM | POA: Diagnosis not present

## 2023-11-03 DIAGNOSIS — L400 Psoriasis vulgaris: Secondary | ICD-10-CM | POA: Diagnosis not present

## 2023-11-03 DIAGNOSIS — K429 Umbilical hernia without obstruction or gangrene: Secondary | ICD-10-CM | POA: Diagnosis not present

## 2023-11-05 ENCOUNTER — Ambulatory Visit: Payer: Self-pay | Admitting: Urology

## 2023-11-05 ENCOUNTER — Other Ambulatory Visit: Payer: Self-pay

## 2023-11-05 ENCOUNTER — Other Ambulatory Visit (HOSPITAL_COMMUNITY): Payer: Self-pay

## 2023-11-05 ENCOUNTER — Encounter: Payer: Self-pay | Admitting: Urology

## 2023-11-05 VITALS — BP 125/76 | HR 87

## 2023-11-05 DIAGNOSIS — N400 Enlarged prostate without lower urinary tract symptoms: Secondary | ICD-10-CM | POA: Diagnosis not present

## 2023-11-05 DIAGNOSIS — R351 Nocturia: Secondary | ICD-10-CM | POA: Diagnosis not present

## 2023-11-05 LAB — URINALYSIS, ROUTINE W REFLEX MICROSCOPIC
Bilirubin, UA: NEGATIVE
Glucose, UA: NEGATIVE
Ketones, UA: NEGATIVE
Leukocytes,UA: NEGATIVE
Nitrite, UA: NEGATIVE
Protein,UA: NEGATIVE
RBC, UA: NEGATIVE
Specific Gravity, UA: 1.01 (ref 1.005–1.030)
Urobilinogen, Ur: 0.2 mg/dL (ref 0.2–1.0)
pH, UA: 7 (ref 5.0–7.5)

## 2023-11-05 LAB — BLADDER SCAN AMB NON-IMAGING: Scan Result: 243

## 2023-11-05 MED ORDER — SILODOSIN 8 MG PO CAPS
8.0000 mg | ORAL_CAPSULE | Freq: Two times a day (BID) | ORAL | 11 refills | Status: AC
  Filled 2023-11-05 – 2023-11-23 (×5): qty 60, 30d supply, fill #0
  Filled 2023-12-19: qty 60, 30d supply, fill #1
  Filled 2024-01-14: qty 60, 30d supply, fill #2
  Filled 2024-02-15: qty 60, 30d supply, fill #3
  Filled 2024-03-12: qty 60, 30d supply, fill #4
  Filled 2024-04-23 – 2024-05-19 (×4): qty 60, 30d supply, fill #5

## 2023-11-05 NOTE — Patient Instructions (Signed)

## 2023-11-05 NOTE — Progress Notes (Signed)
 11/05/2023 8:56 AM   Corey Reilly 1961/01/16 [id number]  Referring provider: Regino Slater, MD [address] [place],  [place] [address]  Followup BPH   HPI: [name] is a 62yo here for followup for BPH and nocturia. PVR 243cc. IPSS 23 QOL 5 on rapaflo  8mg . Since last visit he has noted his urinary urgency is worse, urine stream is weaker, occasional straining to urinate. He has starting/stopping of his urinary stream. Nocturia 2x. He feels this LUTS are returning to the severity prior to starting the rapaflo .    PMH: Past Medical History:  Diagnosis Date   Bipolar disorder (HCC)    Depression    Psoriasis    Ulcer     Surgical History: No past surgical history on file.  Home Medications:  Allergies as of 11/05/2023   No Known Allergies      Medication List        Accurate as of November 05, 2023  8:56 AM. If you have any questions, ask your nurse or doctor.          atorvastatin  20 MG tablet Commonly known as: LIPITOR Take 1 tablet (20 mg total) by mouth daily.   Eszopiclone  3 MG Tabs Take 1 tablet (3 mg total) by mouth at bedtime. Take immediately before bedtime   Humira  (2 Pen) 40 MG/0.8ML Ajkt pen Generic drug: adalimumab  Inject 1 pen injector subcutaneously every two weeks   lisdexamfetamine 40 MG capsule Commonly known as: VYVANSE  Take 1 capsule (40 mg total) by mouth daily.   lithium  carbonate 450 MG ER tablet Commonly known as: ESKALITH  Take 1 tablet (450 mg total) by mouth 2 (two) times daily.   QUEtiapine  100 MG tablet Commonly known as: SEROQUEL  Take 3 tablets (300 mg total) by mouth at bedtime. What changed: how much to take   silodosin  8 MG Caps capsule Commonly known as: RAPAFLO  Take 1 capsule (8 mg total) by mouth at bedtime.        Allergies: No Known Allergies  Family History: Family History  Problem Relation Age of Onset   Hyperlipidemia Father    COPD Maternal Uncle     Social History:  reports  that he has never smoked. He has never used smokeless tobacco. No history on file for alcohol use and drug use.  ROS: All other review of systems were reviewed and are negative except what is noted above in HPI  Physical Exam: BP 125/76   Pulse 87   Constitutional:  Alert and oriented, No acute distress. HEENT: [place] AT, moist mucus membranes.  Trachea midline, no masses. Cardiovascular: No clubbing, cyanosis, or edema. Respiratory: Normal respiratory effort, no increased work of breathing. GI: Abdomen is soft, nontender, nondistended, no abdominal masses GU: No CVA tenderness.  Lymph: No cervical or inguinal lymphadenopathy. Skin: No rashes, bruises or suspicious lesions. Neurologic: Grossly intact, no focal deficits, moving all 4 extremities. Psychiatric: Normal mood and affect.  Laboratory Data: Lab Results  Component Value Date   WBC 19.2 (H) 05/21/2011   HGB 16.7 05/21/2011   HCT 46.1 05/21/2011   MCV 93.9 05/21/2011   PLT 216 05/21/2011    Lab Results  Component Value Date   CREATININE 1.47 (H) 10/04/2023    No results found for: PSA  No results found for: TESTOSTERONE  No results found for: HGBA1C  Urinalysis    Component Value Date/Time   COLORURINE YELLOW 05/21/2011 1807   APPEARANCEUR Clear 04/14/2023 1017   LABSPEC 1.020 05/21/2011  1807   PHURINE 6.0 05/21/2011 1807   GLUCOSEU Negative 04/14/2023 1017   HGBUR NEGATIVE 05/21/2011 1807   BILIRUBINUR Negative 04/14/2023 1017   KETONESUR >80 (A) 05/21/2011 1807   PROTEINUR Negative 04/14/2023 1017   PROTEINUR 30 (A) 05/21/2011 1807   UROBILINOGEN 1.0 05/21/2011 1807   NITRITE Negative 04/14/2023 1017   NITRITE NEGATIVE 05/21/2011 1807   LEUKOCYTESUR Negative 04/14/2023 1017    Lab Results  Component Value Date   LABMICR Comment 04/14/2023   BACTERIA RARE 05/21/2011    Pertinent Imaging:  No results found for this or any previous visit.  No results found for this or any previous  visit.  No results found for this or any previous visit.  No results found for this or any previous visit.  No results found for this or any previous visit.  No results found for this or any previous visit.  No results found for this or any previous visit.  No results found for this or any previous visit.   Assessment & Plan:    1. Benign prostatic hyperplasia, unspecified whether lower urinary tract symptoms present (Primary) Increase rapaflo  8mg  to BID - Urinalysis, Routine w reflex microscopic - BLADDER SCAN AMB NON-IMAGING  2. Nocturia -increase rapaflo  to 8mg  BID   No follow-ups on file.  Belvie Clara, MD  [facility] [place]

## 2023-11-05 NOTE — Progress Notes (Signed)
 Bladder Scan completed today.  Patient can void prior to the bladder scan. Bladder scan result: 243  Performed By: [facility] LPN

## 2023-11-15 ENCOUNTER — Other Ambulatory Visit: Payer: Self-pay | Admitting: Internal Medicine

## 2023-11-15 ENCOUNTER — Other Ambulatory Visit (HOSPITAL_COMMUNITY): Payer: Self-pay

## 2023-11-17 ENCOUNTER — Other Ambulatory Visit (HOSPITAL_COMMUNITY): Payer: Self-pay

## 2023-11-23 ENCOUNTER — Other Ambulatory Visit (HOSPITAL_COMMUNITY): Payer: Self-pay

## 2023-11-30 ENCOUNTER — Other Ambulatory Visit: Payer: Self-pay | Admitting: Surgery

## 2023-11-30 DIAGNOSIS — K429 Umbilical hernia without obstruction or gangrene: Secondary | ICD-10-CM | POA: Diagnosis not present

## 2023-12-03 ENCOUNTER — Other Ambulatory Visit (HOSPITAL_COMMUNITY): Payer: Self-pay

## 2023-12-07 ENCOUNTER — Other Ambulatory Visit (HOSPITAL_COMMUNITY): Payer: Self-pay

## 2023-12-07 ENCOUNTER — Other Ambulatory Visit: Payer: Self-pay

## 2023-12-17 ENCOUNTER — Encounter (HOSPITAL_BASED_OUTPATIENT_CLINIC_OR_DEPARTMENT_OTHER): Payer: Self-pay | Admitting: Surgery

## 2023-12-17 ENCOUNTER — Other Ambulatory Visit: Payer: Self-pay

## 2023-12-17 MED ORDER — ENSURE PRE-SURGERY PO LIQD: 296.0000 mL | Freq: Once | ORAL | Status: DC

## 2023-12-17 MED ORDER — CHLORHEXIDINE GLUCONATE CLOTH 2 % EX PADS: 6.0000 | MEDICATED_PAD | Freq: Once | CUTANEOUS | Status: DC

## 2023-12-17 NOTE — Progress Notes (Signed)

## 2023-12-19 ENCOUNTER — Other Ambulatory Visit: Payer: Self-pay | Admitting: Internal Medicine

## 2023-12-19 ENCOUNTER — Other Ambulatory Visit (HOSPITAL_COMMUNITY): Payer: Self-pay

## 2023-12-19 ENCOUNTER — Other Ambulatory Visit: Payer: Self-pay | Admitting: Psychiatry

## 2023-12-19 DIAGNOSIS — F902 Attention-deficit hyperactivity disorder, combined type: Secondary | ICD-10-CM

## 2023-12-19 DIAGNOSIS — F3110 Bipolar disorder, current episode manic without psychotic features, unspecified: Secondary | ICD-10-CM

## 2023-12-20 ENCOUNTER — Other Ambulatory Visit: Payer: Self-pay

## 2023-12-20 ENCOUNTER — Encounter (HOSPITAL_COMMUNITY): Payer: Self-pay

## 2023-12-20 ENCOUNTER — Other Ambulatory Visit (HOSPITAL_COMMUNITY): Payer: Self-pay

## 2023-12-20 MED ORDER — QUETIAPINE FUMARATE 100 MG PO TABS
200.0000 mg | ORAL_TABLET | Freq: Every day | ORAL | 0 refills | Status: DC
  Filled 2023-12-20: qty 180, 90d supply, fill #0

## 2023-12-21 ENCOUNTER — Encounter (HOSPITAL_COMMUNITY): Payer: Self-pay

## 2023-12-21 ENCOUNTER — Other Ambulatory Visit (HOSPITAL_COMMUNITY): Payer: Self-pay

## 2023-12-21 NOTE — H&P (Signed)
 REFERRING PHYSICIAN: Chet Marca Lev* PROVIDER: VICENTA DASIE POLI, MD MRN: [id number] DOB: 16-May-1960 DATE OF ENCOUNTER: 11/30/2023 Subjective   Chief Complaint: Umbilical Hernia  History of Present Illness: Corey Reilly is a 63 y.o. male who is seen today as an office consultation for evaluation of Umbilical Hernia  This is a pleasant 63 year old gentleman referred to me for evaluation of umbilical hernia. He started noticing a bulge at his umbilicus approximately 6 months ago. He reports he has only mild discomfort on occasion and that it will easily reduce but is sometimes more difficult than others. He has had no obstructive symptoms and no nausea or vomiting. Again, the discomfort is only mild. He is otherwise without complaints and has had no previous abdominal surgery  Review of Systems: A complete review of systems was obtained from the patient. I have reviewed this information and discussed as appropriate with the patient. See HPI as well for other ROS.  ROS   Medical History: History reviewed. No pertinent past medical history.  There is no problem list on file for this patient.  History reviewed. No pertinent surgical history.   No Known Allergies  Current Outpatient Medications on File Prior to Visit  Medication Sig Dispense Refill  atorvastatin  (LIPITOR) 20 MG tablet Take 20 mg by mouth once daily  eszopiclone  (LUNESTA ) 3 mg tablet Take 3 mg by mouth at bedtime  HUMIRA  PEN 40 mg/0.8 mL pen injector kit Inject 40 mg subcutaneously every 14 (fourteen) days  lisdexamfetamine (VYVANSE ) 40 MG capsule Take 40 mg by mouth every morning  lithium  carbonate 450 MG ER tablet Take 450 mg by mouth 2 (two) times daily   No current facility-administered medications on file prior to visit.   Family History  Problem Relation Age of Onset  Breast cancer Mother  Colon cancer Mother  Diabetes Father  Hyperlipidemia (Elevated cholesterol) Father  COPD Maternal Uncle     Social History   Tobacco Use  Smoking Status Never  Smokeless Tobacco Never    Social History   Socioeconomic History  Marital status: Married  Tobacco Use  Smoking status: Never  Smokeless tobacco: Never  Vaping Use  Vaping status: Never Used  Substance and Sexual Activity  Alcohol use: Never  Drug use: Never  Sexual activity: Defer   Social Drivers of Health   Housing Stability: Unknown (11/30/2023)  Housing Stability Vital Sign  Homeless in the Last Year: No   Objective:   Vitals:  11/30/23 1036  BP: 132/74  Pulse: 75  Temp: 36.8 C (98.3 F)  TempSrc: Temporal  SpO2: 99%  Weight: 81.4 kg (179 lb 6.4 oz)  Height: 180.3 cm (5' 11)  PainSc: 0-No pain   Body mass index is 25.02 kg/m.  Physical Exam   He appears well on exam  His abdomen is soft and nontender.  He has an easily reducible, nontender umbilical hernia with a 1 cm fascial defect  Labs, Imaging and Diagnostic Testing: I have reviewed his notes in the electronic medical records  Assessment and Plan:   Diagnoses and all orders for this visit:  Umbilical hernia without obstruction and without gangrene   At this point we discussed abdominal wall anatomy and hernias. We discussed continued conservative management versus surgical repair. From a surgical standpoint we discussed both the open and laparoscopic techniques as well as use of mesh. I would recommend an open umbilical hernia repair with mesh. I explained the reasons to use mesh. I explained the surgical procedure in detail. We  discussed the risks which includes but is not limited to bleeding, infection, injury to surrounding structures, use of mesh, hernia recurrence, cardiopulmonary issues with anesthesia, postoperative recovery, etc. He understands and wishes to proceed with surgery which will be scheduled

## 2023-12-22 ENCOUNTER — Ambulatory Visit (HOSPITAL_BASED_OUTPATIENT_CLINIC_OR_DEPARTMENT_OTHER)
Admission: RE | Admit: 2023-12-22 | Discharge: 2023-12-22 | Disposition: A | Discharge status: Home / Self Care | Attending: Surgery | Admitting: Surgery

## 2023-12-22 ENCOUNTER — Ambulatory Visit (HOSPITAL_BASED_OUTPATIENT_CLINIC_OR_DEPARTMENT_OTHER): Payer: Self-pay | Admitting: Anesthesiology

## 2023-12-22 ENCOUNTER — Encounter (HOSPITAL_BASED_OUTPATIENT_CLINIC_OR_DEPARTMENT_OTHER): Payer: Self-pay | Admitting: Surgery

## 2023-12-22 ENCOUNTER — Encounter (HOSPITAL_BASED_OUTPATIENT_CLINIC_OR_DEPARTMENT_OTHER)
Admission: RE | Disposition: A | Discharge status: Home / Self Care | Payer: Self-pay | Source: Home / Self Care | Attending: Surgery

## 2023-12-22 ENCOUNTER — Other Ambulatory Visit: Payer: Self-pay

## 2023-12-22 ENCOUNTER — Other Ambulatory Visit (HOSPITAL_COMMUNITY): Payer: Self-pay

## 2023-12-22 DIAGNOSIS — K429 Umbilical hernia without obstruction or gangrene: Secondary | ICD-10-CM | POA: Diagnosis not present

## 2023-12-22 DIAGNOSIS — Z01818 Encounter for other preprocedural examination: Secondary | ICD-10-CM

## 2023-12-22 DIAGNOSIS — F319 Bipolar disorder, unspecified: Secondary | ICD-10-CM | POA: Diagnosis not present

## 2023-12-22 HISTORY — DX: Attention-deficit hyperactivity disorder, unspecified type: F90.9

## 2023-12-22 HISTORY — DX: Benign prostatic hyperplasia with lower urinary tract symptoms: N40.1

## 2023-12-22 HISTORY — PX: UMBILICAL HERNIA REPAIR: SHX196

## 2023-12-22 SURGERY — REPAIR, HERNIA, UMBILICAL, ADULT
Anesthesia: General | Site: Abdomen

## 2023-12-22 MED ORDER — CEFAZOLIN SODIUM-DEXTROSE 2-4 GM/100ML-% IV SOLN
INTRAVENOUS | Status: AC
  Filled 2023-12-22: qty 100

## 2023-12-22 MED ORDER — KETOROLAC TROMETHAMINE 30 MG/ML IJ SOLN
INTRAMUSCULAR | Status: AC
Start: 2023-12-22 — End: 2023-12-22
  Filled 2023-12-22: qty 1

## 2023-12-22 MED ORDER — FENTANYL CITRATE (PF) 100 MCG/2ML IJ SOLN: 25.0000 ug | INTRAMUSCULAR | Status: DC | PRN

## 2023-12-22 MED ORDER — OXYCODONE HCL 5 MG PO TABS: 5.0000 mg | ORAL_TABLET | Freq: Once | ORAL | Status: DC | PRN

## 2023-12-22 MED ORDER — MIDAZOLAM HCL 5 MG/5ML IJ SOLN
INTRAMUSCULAR | Status: DC | PRN
  Administered 2023-12-22: 2 mg via INTRAVENOUS

## 2023-12-22 MED ORDER — LIDOCAINE 2% (20 MG/ML) 5 ML SYRINGE
INTRAMUSCULAR | Status: DC | PRN
  Administered 2023-12-22: 60 mg via INTRAVENOUS

## 2023-12-22 MED ORDER — DEXAMETHASONE SODIUM PHOSPHATE 10 MG/ML IJ SOLN
INTRAMUSCULAR | Status: DC | PRN
  Administered 2023-12-22: 8 mg via INTRAVENOUS

## 2023-12-22 MED ORDER — SCOPOLAMINE 1 MG/3DAYS TD PT72SCOPOLAMINE 1 MG/3DAYS
MEDICATED_PATCH | TRANSDERMAL | Status: AC
Start: 2023-12-22 — End: 2023-12-22
  Filled 2023-12-22: qty 1

## 2023-12-22 MED ORDER — ONDANSETRON HCL 4 MG/2ML IJ SOLN
INTRAMUSCULAR | Status: DC | PRN
  Administered 2023-12-22: 4 mg via INTRAVENOUS

## 2023-12-22 MED ORDER — OXYCODONE HCL 5 MG/5ML PO SOLN: 5.0000 mg | Freq: Once | ORAL | Status: DC | PRN

## 2023-12-22 MED ORDER — GLYCOPYRROLATE PF 0.2 MG/ML IJ SOSY
PREFILLED_SYRINGE | INTRAMUSCULAR | Status: DC | PRN
  Administered 2023-12-22: .2 mg via INTRAVENOUS

## 2023-12-22 MED ORDER — PHENYLEPHRINE 80 MCG/ML (10ML) SYRINGE FOR IV PUSH (FOR BLOOD PRESSURE SUPPORT)
PREFILLED_SYRINGE | INTRAVENOUS | Status: DC | PRN
  Administered 2023-12-22 (×3): 160 ug via INTRAVENOUS

## 2023-12-22 MED ORDER — LACTATED RINGERS IV SOLN: INTRAVENOUS | Status: DC | PRN

## 2023-12-22 MED ORDER — ACETAMINOPHEN 500 MG PO TABS
1000.0000 mg | ORAL_TABLET | ORAL | Status: AC
  Administered 2023-12-22: 1000 mg via ORAL

## 2023-12-22 MED ORDER — DEXAMETHASONE SODIUM PHOSPHATE 10 MG/ML IJ SOLN
INTRAMUSCULAR | Status: AC
  Filled 2023-12-22: qty 1

## 2023-12-22 MED ORDER — BUPIVACAINE-EPINEPHRINE (PF) 0.5% -1:200000 IJ SOLN
INTRAMUSCULAR | Status: AC
  Filled 2023-12-22: qty 60

## 2023-12-22 MED ORDER — KETOROLAC TROMETHAMINE 30 MG/ML IJ SOLN
INTRAMUSCULAR | Status: DC | PRN
  Administered 2023-12-22: 30 mg via INTRAVENOUS

## 2023-12-22 MED ORDER — ONDANSETRON HCL 4 MG PO TABS
4.0000 mg | ORAL_TABLET | Freq: Every day | ORAL | 1 refills | Status: AC | PRN
  Filled 2023-12-22: qty 30, 30d supply, fill #0

## 2023-12-22 MED ORDER — LACTATED RINGERS IV SOLN: INTRAVENOUS | Status: DC

## 2023-12-22 MED ORDER — EPHEDRINE 5 MG/ML INJ
INTRAVENOUS | Status: AC
  Filled 2023-12-22: qty 5

## 2023-12-22 MED ORDER — ACETAMINOPHEN 500 MG PO TABS
ORAL_TABLET | ORAL | Status: AC
  Filled 2023-12-22: qty 2

## 2023-12-22 MED ORDER — PHENYLEPHRINE 80 MCG/ML (10ML) SYRINGE FOR IV PUSH (FOR BLOOD PRESSURE SUPPORT)
PREFILLED_SYRINGE | INTRAVENOUS | Status: AC
Start: 2023-12-22 — End: 2023-12-22
  Filled 2023-12-22: qty 10

## 2023-12-22 MED ORDER — PROPOFOL 10 MG/ML IV BOLUS
INTRAVENOUS | Status: DC | PRN
Start: 2023-12-22 — End: 2023-12-22
  Administered 2023-12-22: 200 mg via INTRAVENOUS

## 2023-12-22 MED ORDER — BUPIVACAINE-EPINEPHRINE 0.5% -1:200000 IJ SOLN
INTRAMUSCULAR | Status: DC | PRN
  Administered 2023-12-22: 20 mL

## 2023-12-22 MED ORDER — LIDOCAINE 2% (20 MG/ML) 5 ML SYRINGE
INTRAMUSCULAR | Status: AC
  Filled 2023-12-22: qty 5

## 2023-12-22 MED ORDER — MIDAZOLAM HCL 2 MG/2ML IJ SOLN
INTRAMUSCULAR | Status: AC
  Filled 2023-12-22: qty 2

## 2023-12-22 MED ORDER — AMISULPRIDE (ANTIEMETIC) 5 MG/2ML IV SOLN: 10.0000 mg | Freq: Once | INTRAVENOUS | Status: DC | PRN

## 2023-12-22 MED ORDER — LIDOCAINE-EPINEPHRINE 1 %-1:100000 IJ SOLN
INTRAMUSCULAR | Status: AC
  Filled 2023-12-22: qty 1

## 2023-12-22 MED ORDER — FENTANYL CITRATE (PF) 100 MCG/2ML IJ SOLN
INTRAMUSCULAR | Status: AC
  Filled 2023-12-22: qty 2

## 2023-12-22 MED ORDER — KETOROLAC TROMETHAMINE 30 MG/ML IJ SOLN: 30.0000 mg | Freq: Once | INTRAMUSCULAR | Status: DC | PRN

## 2023-12-22 MED ORDER — ONDANSETRON HCL 4 MG/2ML IJ SOLN
INTRAMUSCULAR | Status: AC
Start: 2023-12-22 — End: 2023-12-22
  Filled 2023-12-22: qty 2

## 2023-12-22 MED ORDER — EPHEDRINE SULFATE-NACL 50-0.9 MG/10ML-% IV SOSY
PREFILLED_SYRINGE | INTRAVENOUS | Status: DC | PRN
  Administered 2023-12-22: 5 mg via INTRAVENOUS
  Administered 2023-12-22: 10 mg via INTRAVENOUS

## 2023-12-22 MED ORDER — OXYCODONE HCL 5 MG PO TABS
5.0000 mg | ORAL_TABLET | Freq: Four times a day (QID) | ORAL | 0 refills | Status: AC | PRN
  Filled 2023-12-22: qty 20, 5d supply, fill #0

## 2023-12-22 MED ORDER — CEFAZOLIN SODIUM-DEXTROSE 2-4 GM/100ML-% IV SOLN
2.0000 g | INTRAVENOUS | Status: AC
  Administered 2023-12-22: 2 g via INTRAVENOUS

## 2023-12-22 MED ORDER — GLYCOPYRROLATE PF 0.2 MG/ML IJ SOSY
PREFILLED_SYRINGE | INTRAMUSCULAR | Status: AC
  Filled 2023-12-22: qty 1

## 2023-12-22 MED ORDER — PROPOFOL 500 MG/50ML IV EMUL
INTRAVENOUS | Status: DC | PRN
  Administered 2023-12-22: 100 ug/kg/min via INTRAVENOUS

## 2023-12-22 MED ORDER — FENTANYL CITRATE (PF) 100 MCG/2ML IJ SOLN
INTRAMUSCULAR | Status: DC | PRN
  Administered 2023-12-22: 50 ug via INTRAVENOUS

## 2023-12-22 MED ORDER — ACETAMINOPHEN 500 MG PO TABS: 1000.0000 mg | ORAL_TABLET | Freq: Once | ORAL | Status: AC

## 2023-12-22 SURGICAL SUPPLY — 38 items
BLADE CLIPPER SURG (BLADE) IMPLANT
BLADE SURG 15 STRL LF DISP TIS (BLADE) ×1 IMPLANT
CANISTER SUCT 1200ML W/VALVE (MISCELLANEOUS) IMPLANT
CHLORAPREP W/TINT 26 (MISCELLANEOUS) ×1 IMPLANT
COVER BACK TABLE 60X90IN (DRAPES) ×1 IMPLANT
COVER MAYO STAND STRL (DRAPES) ×1 IMPLANT
DERMABOND ADVANCED .7 DNX12 (GAUZE/BANDAGES/DRESSINGS) ×2 IMPLANT
DRAPE LAPAROTOMY 100X72 PEDS (DRAPES) ×1 IMPLANT
DRAPE UTILITY XL STRL (DRAPES) ×1 IMPLANT
DRSG TEGADERM 2-3/8X2-3/4 SM (GAUZE/BANDAGES/DRESSINGS) IMPLANT
ELECTRODE REM PT RTRN 9FT ADLT (ELECTROSURGICAL) ×1 IMPLANT
GLOVE BIO SURGEON STRL SZ7.5 (GLOVE) IMPLANT
GLOVE BIOGEL PI IND STRL 7.0 (GLOVE) IMPLANT
GLOVE BIOGEL PI IND STRL 7.5 (GLOVE) IMPLANT
GLOVE BIOGEL PI IND STRL 8 (GLOVE) IMPLANT
GLOVE SURG SIGNA 7.5 PF LTX (GLOVE) ×1 IMPLANT
GLOVE SURG SS PI 7.0 STRL IVOR (GLOVE) IMPLANT
GOWN STRL REUS W/ TWL LRG LVL3 (GOWN DISPOSABLE) ×1 IMPLANT
GOWN STRL REUS W/ TWL XL LVL3 (GOWN DISPOSABLE) ×1 IMPLANT
GOWN STRL REUS W/TWL XL LVL3 (GOWN DISPOSABLE) IMPLANT
MESH VENTRALEX ST 1-7/10 CRC S (Mesh General) IMPLANT
NDL HYPO 25X1 1.5 SAFETY (NEEDLE) ×1 IMPLANT
NEEDLE HYPO 25X1 1.5 SAFETY (NEEDLE) ×1 IMPLANT
NS IRRIG 1000ML POUR BTL (IV SOLUTION) IMPLANT
PACK BASIN DAY SURGERY FS (CUSTOM PROCEDURE TRAY) ×1 IMPLANT
PENCIL SMOKE EVACUATOR (MISCELLANEOUS) ×1 IMPLANT
SLEEVE SCD COMPRESS KNEE MED (STOCKING) ×1 IMPLANT
SPIKE FLUID TRANSFER (MISCELLANEOUS) IMPLANT
SPONGE T-LAP 4X18 ~~LOC~~+RFID (SPONGE) IMPLANT
SUT MNCRL AB 4-0 PS2 18 (SUTURE) ×1 IMPLANT
SUT NOVA 0 T19/GS 22DT (SUTURE) IMPLANT
SUT NOVA NAB GS-21 1 T12 (SUTURE) IMPLANT
SUT VIC AB 2-0 SH 27XBRD (SUTURE) IMPLANT
SUT VIC AB 3-0 SH 27X BRD (SUTURE) ×1 IMPLANT
SYR CONTROL 10ML LL (SYRINGE) ×1 IMPLANT
TOWEL GREEN STERILE FF (TOWEL DISPOSABLE) ×1 IMPLANT
TUBE CONNECTING 20X1/4 (TUBING) IMPLANT
YANKAUER SUCT BULB TIP NO VENT (SUCTIONS) IMPLANT

## 2023-12-22 NOTE — Transfer of Care (Signed)
 Immediate Anesthesia Transfer of Care Note  Patient: Corey Reilly  Procedure(s) Performed: UMBILICAL HERNIA REPAIR WITH MESH, ADULT (Abdomen)  Patient Location: PACU  Anesthesia Type:General  Level of Consciousness: drowsy and responds to stimulation  Airway & Oxygen Therapy: Patient Spontanous Breathing and Patient connected to face mask oxygen  Post-op Assessment: Report given to RN and Post -op Vital signs reviewed and stable  Post vital signs: Reviewed and stable  Last Vitals:  Vitals Value Taken Time  BP 103/64 12/22/23 09:15  Temp    Pulse 69 12/22/23 09:16  Resp 16 12/22/23 09:16  SpO2 100 % 12/22/23 09:16  Vitals shown include unfiled device data.  Last Pain:  Vitals:   12/22/23 0659  TempSrc: Temporal  PainSc: 0-No pain      Patients Stated Pain Goal: 6 (12/22/23 0659)  Complications: No notable events documented.

## 2023-12-22 NOTE — Anesthesia Procedure Notes (Addendum)
 Procedure Name: LMA Insertion Date/Time: 12/22/2023 8:24 AM  Performed by: Denton Niels CROME, CRNAPre-anesthesia Checklist: Patient identified, Emergency Drugs available, Suction available, Patient being monitored and Timeout performed Patient Re-evaluated:Patient Re-evaluated prior to induction Oxygen Delivery Method: Circle system utilized Preoxygenation: Pre-oxygenation with 100% oxygen Induction Type: IV induction Ventilation: Mask ventilation without difficulty LMA: LMA inserted LMA Size: 5.0 Number of attempts: 1 Placement Confirmation: positive ETCO2 Dental Injury: Teeth and Oropharynx as per pre-operative assessment

## 2023-12-22 NOTE — Interval H&P Note (Signed)
 History and Physical Interval Note: no change in H and P  12/22/2023 8:03 AM  Corey Reilly  has presented today for surgery, with the diagnosis of UMBILICAL HERNIA.  The various methods of treatment have been discussed with the patient and family. After consideration of risks, benefits and other options for treatment, the patient has consented to  Procedure(s) with comments: REPAIR, HERNIA, UMBILICAL, ADULT (N/A) - GEN/LMA WITH MESH as a surgical intervention.  The patient's history has been reviewed, patient examined, no change in status, stable for surgery.  I have reviewed the patient's chart and labs.  Questions were answered to the patient's satisfaction.     Corey Reilly

## 2023-12-22 NOTE — Anesthesia Preprocedure Evaluation (Addendum)
 Anesthesia Evaluation  Patient identified by MRN, date of birth, ID band Patient awake    Reviewed: Allergy & Precautions, NPO status , Patient's Chart, lab work & pertinent test results  Airway Mallampati: II       Dental no notable dental hx.    Pulmonary neg pulmonary ROS   Pulmonary exam normal        Cardiovascular negative cardio ROS Normal cardiovascular exam     Neuro/Psych  PSYCHIATRIC DISORDERS  Depression Bipolar Disorder   negative neurological ROS     GI/Hepatic negative GI ROS, Neg liver ROS,,,  Endo/Other  negative endocrine ROS    Renal/GU negative Renal ROS     Musculoskeletal negative musculoskeletal ROS (+)    Abdominal   Peds  (+) ADHD Hematology negative hematology ROS (+)   Anesthesia Other Findings UMBILICAL HERNIA  Reproductive/Obstetrics                              Anesthesia Physical Anesthesia Plan  ASA: 2  Anesthesia Plan: General   Post-op Pain Management:    Induction: Intravenous  PONV Risk Score and Plan: 2 and Ondansetron , Dexamethasone , Midazolam  and Treatment may vary due to age or medical condition  Airway Management Planned: LMA  Additional Equipment:   Intra-op Plan:   Post-operative Plan: Extubation in OR  Informed Consent: I have reviewed the patients History and Physical, chart, labs and discussed the procedure including the risks, benefits and alternatives for the proposed anesthesia with the patient or authorized representative who has indicated his/her understanding and acceptance.     Dental advisory given  Plan Discussed with: CRNA  Anesthesia Plan Comments:          Anesthesia Quick Evaluation

## 2023-12-22 NOTE — Op Note (Signed)
   Corey Reilly 12/22/2023   Pre-op Diagnosis: UMBILICAL HERNIA 1 CM FASCIAL DEFECT     Post-op Diagnosis: SAME  Procedure(s): OPEN UMBILICAL HERNIA REPAIR WITH MESH (1 CM FASCIAL DEFECT)  Surgeon(s): Vernetta Berg, MD Colette Soulier, MD [facility] Resident  Anesthesia: General  Staff:  Circulator: Elaine Avelina JINNY, RN Relief Circulator: Mannie Ellouise LABOR, RN Relief Scrub: Alver Greig HERO, RN Scrub Person: Alto Charmaine JINNY  Estimated Blood Loss: Minimal               Findings: The patient was found to have a 1 cm fascial defect which was repaired with a 4.3 cm round ventral Prolene patch from [facility]  Procedure: The patient was brought to the operating room identified the correct patient.  He was placed upon the operating table and general anesthesia was induced.  His abdomen was prepped and draped in usual sterile fashion.  We anesthetized the skin at the lower edge of the umbilicus with a Marcaine .   we then made a semicircular incision at the lower edge of the umbilicus with a scalpel.  We then dissected down into the subcutaneous tissue with electrocautery.  We separated the overlying umbilical skin from the hernia sac.  The sac was opened and all contents that are been reduced.  We then excised the redundant sac with the cautery and expose the fascia circumferentially.  The fascial defect was only 1 cm in size.  We brought a 4.3 cm round ventral Prolene patch onto the field.  We placed it through the fascial opening and then pulled it up against the peritoneum with stay ties.  The mesh was then sutured in place circumferentially with 0 Novafil sutures.  We then closed the fascia over the top of the mesh with figure-of-eight 0 Novafil sutures.  Good closure of the fascia and wide coverage.  To be achieved.  We cut the stay ties prior to closing the fascia.  We then anesthetized the fascia circumferentially with Marcaine .  Hemostased appear to be achieved.  The umbilical skin was  tacked in place with a 3-0 Vicryl suture.  The subcutaneous tissue was then closed interrupted 3-0 Vicryl sutures and the skin was closed with a running 4-0 Monocryl.  Dermabond was then applied.  The patient tolerated the procedure well.  All the counts were correct at the end of the procedure.  The patient was then extubated in the operating room and taken in a stable condition to the recovery room.          Berg Vernetta   Date: 12/22/2023  Time: 9:01 AM

## 2023-12-22 NOTE — Discharge Instructions (Addendum)
 [facility]__[facility]  UMBILICAL OR INGUINAL HERNIA REPAIR: POST OP INSTRUCTIONS  Always review your discharge instruction sheet given to you by the facility where your surgery was performed. IF YOU HAVE DISABILITY OR FAMILY LEAVE FORMS, YOU MUST BRING THEM TO THE OFFICE FOR PROCESSING.   DO NOT GIVE THEM TO YOUR DOCTOR.  1. A  prescription for pain medication may be given to you upon discharge.  Take your pain medication as prescribed, if needed.  If narcotic pain medicine is not needed, then you may take acetaminophen  (Tylenol ) or ibuprofen (Advil) as needed. 2. Take your usually prescribed medications unless otherwise directed. If you need a refill on your pain medication, please contact your pharmacy.  They will contact our office to request authorization. Prescriptions will not be filled after 5 pm or on week-ends. 3. You should follow a light diet the first 24 hours after arrival home, such as soup and crackers, etc.  Be sure to include lots of fluids daily.  Resume your normal diet the day after surgery. 4.Most patients will experience some swelling and bruising around the umbilicus or in the groin and scrotum.  Ice packs and reclining will help.  Swelling and bruising can take several days to resolve.  6. It is common to experience some constipation if taking pain medication after surgery.  Increasing fluid intake and taking a stool softener (such as Colace) will usually help or prevent this problem from occurring.  A mild laxative (Milk of Magnesia or Miralax) should be taken according to package directions if there are no bowel movements after 48 hours. 7. Unless discharge instructions indicate otherwise, you may remove your bandages 24-48 hours after surgery, and you may shower at that time.  You may have steri-strips (small skin tapes) in place directly over the incision.  These strips should be left on the skin for 7-10 days.  If your surgeon used skin glue on the  incision, you may shower in 24 hours.  The glue will flake off over the next 2-3 weeks.  Any sutures or staples will be removed at the office during your follow-up visit. 8. ACTIVITIES:  You may resume regular (light) daily activities beginning the next day--such as daily self-care, walking, climbing stairs--gradually increasing activities as tolerated.  You may have sexual intercourse when it is comfortable.  Refrain from any heavy lifting or straining until approved by your doctor.  a.You may drive when you are no longer taking prescription pain medication, you can comfortably wear a seatbelt, and you can safely maneuver your car and apply brakes. b.RETURN TO WORK:   _____________________________________________  9.You should see your doctor in the office for a follow-up appointment approximately 2-3 weeks after your surgery.  Make sure that you call for this appointment within a day or two after you arrive home to insure a convenient appointment time. 10.OTHER INSTRUCTIONS: YOU MAY SHOWER STARTING TOMORROW ICE PACK, TYLENOL , AND IBUPROFEN ALSO FOR PAIN NO LIFTING MORE THAN 15 POUNDS FOR 4 WEEKS    _____________________________________  WHEN TO CALL YOUR DOCTOR: Fever over 101.0 Inability to urinate Nausea and/or vomiting Extreme swelling or bruising Continued bleeding from incision. Increased pain, redness, or drainage from the incision  The clinic staff is available to answer your questions during regular business hours.  Please don't hesitate to call and ask to speak to one of the nurses for clinical concerns.  If you have a medical emergency, go to the nearest emergency room or call 911.  A surgeon from [facility] is always on call at the hospital   [address], [place], [place]  [address] ?  [address] [address], [place], [place]   [address] [phone] ? [phone] ? FAX [phone] Web site: [email or web address]      Post Anesthesia  Home Care Instructions  Activity: Get plenty of rest for the remainder of the day. A responsible individual must stay with you for 24 hours following the procedure.  For the next 24 hours, DO NOT: -Drive a car -Advertising copywriter -Drink alcoholic beverages -Take any medication unless instructed by your physician -Make any legal decisions or sign important papers.  Meals: Start with liquid foods such as gelatin or soup. Progress to regular foods as tolerated. Avoid greasy, spicy, heavy foods. If nausea and/or vomiting occur, drink only clear liquids until the nausea and/or vomiting subsides. Call your physician if vomiting continues.  Special Instructions/Symptoms: Your throat may feel dry or sore from the anesthesia or the breathing tube placed in your throat during surgery. If this causes discomfort, gargle with warm salt water. The discomfort should disappear within 24 hours.  If you had a scopolamine  patch placed behind your ear for the management of post- operative nausea and/or vomiting:  1. The medication in the patch is effective for 72 hours, after which it should be removed.  Wrap patch in a tissue and discard in the trash. Wash hands thoroughly with soap and water. 2. You may remove the patch earlier than 72 hours if you experience unpleasant side effects which may include dry mouth, dizziness or visual disturbances. 3. Avoid touching the patch. Wash your hands with soap and water after contact with the patch.    Post Anesthesia Home Care Instructions  Activity: Get plenty of rest for the remainder of the day. A responsible individual must stay with you for 24 hours following the procedure.  For the next 24 hours, DO NOT: -Drive a car -Advertising copywriter -Drink alcoholic beverages -Take any medication unless instructed by your physician -Make any legal decisions or sign important papers.  Meals: Start with liquid foods such as gelatin or soup. Progress to regular foods as  tolerated. Avoid greasy, spicy, heavy foods. If nausea and/or vomiting occur, drink only clear liquids until the nausea and/or vomiting subsides. Call your physician if vomiting continues.  Special Instructions/Symptoms: Your throat may feel dry or sore from the anesthesia or the breathing tube placed in your throat during surgery. If this causes discomfort, gargle with warm salt water. The discomfort should disappear within 24 hours.  If you had a scopolamine  patch placed behind your ear for the management of post- operative nausea and/or vomiting:  1. The medication in the patch is effective for 72 hours, after which it should be removed.  Wrap patch in a tissue and discard in the trash. Wash hands thoroughly with soap and water. 2. You may remove the patch earlier than 72 hours if you experience unpleasant side effects which may include dry mouth, dizziness or visual disturbances. 3. Avoid touching the patch. Wash your hands with soap and water after contact with the patch.     May have tylenol  again after 1:00pm and ibuprofen after 3pm

## 2023-12-23 ENCOUNTER — Encounter (HOSPITAL_BASED_OUTPATIENT_CLINIC_OR_DEPARTMENT_OTHER): Payer: Self-pay | Admitting: Surgery

## 2023-12-23 NOTE — Anesthesia Postprocedure Evaluation (Signed)
 Anesthesia Post Note  Patient: Corey Reilly  Procedure(s) Performed: UMBILICAL HERNIA REPAIR WITH MESH, ADULT (Abdomen)     Patient location during evaluation: PACU Anesthesia Type: General Level of consciousness: awake Pain management: pain level controlled Vital Signs Assessment: post-procedure vital signs reviewed and stable Respiratory status: spontaneous breathing, nonlabored ventilation and respiratory function stable Cardiovascular status: blood pressure returned to baseline and stable Postop Assessment: no apparent nausea or vomiting Anesthetic complications: no   No notable events documented.  Last Vitals:  Vitals:   12/22/23 1000 12/22/23 1030  BP:  107/74  Pulse: 66 (!) 59  Resp: 14 14  Temp:  36.7 C  SpO2: 97% 96%    Last Pain:  Vitals:   12/22/23 1030  TempSrc: Temporal  PainSc:                  [name]

## 2024-01-04 ENCOUNTER — Other Ambulatory Visit (HOSPITAL_COMMUNITY): Payer: Self-pay

## 2024-01-14 ENCOUNTER — Other Ambulatory Visit: Payer: Self-pay | Admitting: Psychiatry

## 2024-01-14 ENCOUNTER — Other Ambulatory Visit: Payer: Self-pay

## 2024-01-14 ENCOUNTER — Other Ambulatory Visit (HOSPITAL_COMMUNITY): Payer: Self-pay

## 2024-01-14 DIAGNOSIS — F902 Attention-deficit hyperactivity disorder, combined type: Secondary | ICD-10-CM

## 2024-01-14 DIAGNOSIS — K429 Umbilical hernia without obstruction or gangrene: Secondary | ICD-10-CM | POA: Diagnosis not present

## 2024-01-14 DIAGNOSIS — Z09 Encounter for follow-up examination after completed treatment for conditions other than malignant neoplasm: Secondary | ICD-10-CM | POA: Diagnosis not present

## 2024-01-14 MED ORDER — LISDEXAMFETAMINE DIMESYLATE 40 MG PO CAPS
40.0000 mg | ORAL_CAPSULE | Freq: Every day | ORAL | 0 refills | Status: DC
  Filled 2024-01-14: qty 90, 90d supply, fill #0

## 2024-02-15 ENCOUNTER — Other Ambulatory Visit (HOSPITAL_COMMUNITY): Payer: Self-pay

## 2024-03-06 ENCOUNTER — Encounter: Payer: Self-pay | Admitting: Psychiatry

## 2024-03-09 ENCOUNTER — Other Ambulatory Visit: Payer: Self-pay | Admitting: Internal Medicine

## 2024-03-10 ENCOUNTER — Other Ambulatory Visit (HOSPITAL_COMMUNITY): Payer: Self-pay

## 2024-03-10 ENCOUNTER — Encounter (HOSPITAL_COMMUNITY): Payer: Self-pay

## 2024-03-12 ENCOUNTER — Other Ambulatory Visit: Payer: Self-pay | Admitting: Psychiatry

## 2024-03-12 ENCOUNTER — Other Ambulatory Visit (HOSPITAL_COMMUNITY): Payer: Self-pay

## 2024-03-12 DIAGNOSIS — F3110 Bipolar disorder, current episode manic without psychotic features, unspecified: Secondary | ICD-10-CM

## 2024-03-13 ENCOUNTER — Other Ambulatory Visit (HOSPITAL_COMMUNITY): Payer: Self-pay

## 2024-03-13 ENCOUNTER — Other Ambulatory Visit: Payer: Self-pay

## 2024-03-13 MED ORDER — QUETIAPINE FUMARATE 100 MG PO TABS
200.0000 mg | ORAL_TABLET | Freq: Every day | ORAL | 0 refills | Status: DC
  Filled 2024-03-13: qty 180, 90d supply, fill #0

## 2024-03-13 NOTE — Telephone Encounter (Signed)
 LF 8/25, LV 6/5, FU 12/10   Pt will be due 11/23 for refill   Corey Reilly

## 2024-03-16 ENCOUNTER — Other Ambulatory Visit (HOSPITAL_COMMUNITY): Payer: Self-pay

## 2024-03-20 ENCOUNTER — Other Ambulatory Visit (HOSPITAL_COMMUNITY): Payer: Self-pay

## 2024-03-20 ENCOUNTER — Encounter (HOSPITAL_COMMUNITY): Payer: Self-pay

## 2024-03-21 ENCOUNTER — Other Ambulatory Visit: Payer: Self-pay

## 2024-03-21 NOTE — Progress Notes (Signed)
 Patient messaged [facility] looking for Humira  Rx. Last fill was 07/05/23 and Rx on file was expired. LVM for him to call us  back to clarify and sent RR to original provider. Will need visit with [facility].

## 2024-03-24 ENCOUNTER — Other Ambulatory Visit (HOSPITAL_COMMUNITY): Payer: Self-pay

## 2024-03-27 ENCOUNTER — Other Ambulatory Visit (HOSPITAL_COMMUNITY): Payer: Self-pay

## 2024-03-29 ENCOUNTER — Other Ambulatory Visit: Payer: Self-pay

## 2024-03-31 ENCOUNTER — Other Ambulatory Visit (HOSPITAL_COMMUNITY): Payer: Self-pay

## 2024-04-02 ENCOUNTER — Other Ambulatory Visit: Payer: Self-pay | Admitting: Psychiatry

## 2024-04-02 DIAGNOSIS — F902 Attention-deficit hyperactivity disorder, combined type: Secondary | ICD-10-CM

## 2024-04-03 ENCOUNTER — Other Ambulatory Visit (HOSPITAL_COMMUNITY): Payer: Self-pay

## 2024-04-03 ENCOUNTER — Other Ambulatory Visit: Payer: Self-pay

## 2024-04-03 MED ORDER — LISDEXAMFETAMINE DIMESYLATE 40 MG PO CAPS
40.0000 mg | ORAL_CAPSULE | Freq: Every day | ORAL | 0 refills | Status: AC
  Filled 2024-04-03: qty 90, 90d supply, fill #0
  Filled ????-??-??: fill #0

## 2024-04-04 ENCOUNTER — Other Ambulatory Visit (HOSPITAL_COMMUNITY): Payer: Self-pay

## 2024-04-04 DIAGNOSIS — Z79899 Other long term (current) drug therapy: Secondary | ICD-10-CM | POA: Diagnosis not present

## 2024-04-04 DIAGNOSIS — L400 Psoriasis vulgaris: Secondary | ICD-10-CM | POA: Diagnosis not present

## 2024-04-05 ENCOUNTER — Other Ambulatory Visit: Payer: Self-pay

## 2024-04-05 ENCOUNTER — Ambulatory Visit: Payer: Self-pay | Admitting: Psychiatry

## 2024-04-05 DIAGNOSIS — Z91199 Patient's noncompliance with other medical treatment and regimen due to unspecified reason: Secondary | ICD-10-CM

## 2024-04-05 NOTE — Progress Notes (Signed)
 No show

## 2024-04-06 ENCOUNTER — Other Ambulatory Visit: Payer: Self-pay

## 2024-04-06 MED ORDER — HUMIRA (2 PEN) 40 MG/0.8ML ~~LOC~~ AJKT
40.0000 mg | AUTO-INJECTOR | SUBCUTANEOUS | 11 refills | Status: DC
  Filled 2024-04-14: qty 1.6, 28d supply, fill #0

## 2024-04-07 ENCOUNTER — Other Ambulatory Visit: Payer: Self-pay

## 2024-04-14 ENCOUNTER — Ambulatory Visit: Payer: 59 | Admitting: Urology

## 2024-04-14 ENCOUNTER — Other Ambulatory Visit (HOSPITAL_COMMUNITY): Payer: Self-pay

## 2024-04-17 ENCOUNTER — Other Ambulatory Visit: Payer: Self-pay

## 2024-04-23 ENCOUNTER — Other Ambulatory Visit (HOSPITAL_COMMUNITY): Payer: Self-pay

## 2024-04-24 ENCOUNTER — Other Ambulatory Visit (HOSPITAL_COMMUNITY): Payer: Self-pay

## 2024-04-24 ENCOUNTER — Telehealth: Payer: Self-pay | Admitting: Pharmacist

## 2024-04-24 NOTE — Telephone Encounter (Signed)
 Called patient to schedule an appointment for the [facility]. I was unable to reach the patient so I left a HIPAA-compliant message requesting that the patient return my call.   Corey Reilly, PharmD, JAQUELINE, CPP Clinical Pharmacist [facility] [phone]

## 2024-04-26 ENCOUNTER — Other Ambulatory Visit: Payer: Self-pay

## 2024-04-26 ENCOUNTER — Ambulatory Visit: Attending: Family Medicine | Admitting: Pharmacist

## 2024-04-26 ENCOUNTER — Telehealth: Payer: Self-pay

## 2024-04-26 ENCOUNTER — Encounter (HOSPITAL_COMMUNITY): Payer: Self-pay

## 2024-04-26 ENCOUNTER — Other Ambulatory Visit: Payer: Self-pay | Admitting: Pharmacist

## 2024-04-26 DIAGNOSIS — Z79899 Other long term (current) drug therapy: Secondary | ICD-10-CM

## 2024-04-26 MED ORDER — HUMIRA (2 PEN) 40 MG/0.8ML ~~LOC~~ AJKT
40.0000 mg | AUTO-INJECTOR | SUBCUTANEOUS | 11 refills | Status: AC
  Filled 2024-05-01: qty 1.6, 28d supply, fill #0
  Filled 2024-05-23: qty 1.6, 28d supply, fill #1

## 2024-04-26 NOTE — Progress Notes (Signed)
 S: Patient presents today to the [facility].  Patient is currently taking Humira  for psoriasis. Patient is managed by Dr. Court for this.   Efficacy: still continues to work well.  Adherence: confirmed   Dosing:  Plaque psoriasis: SubQ: Maintenance: 40 mg every other week   Drug-drug interactions: none  Screening: TB test: completed per patient Hepatitis: completed  Monitoring: S/sx of infection: denies CBC: WNL per patient S/sx of hypersensitivity: denies S/sx of malignancy: denies S/sx of heart failure: denies  O:     Lab Results  Component Value Date   WBC 19.2 (H) 05/21/2011   HGB 16.7 05/21/2011   HCT 46.1 05/21/2011   MCV 93.9 05/21/2011   PLT 216 05/21/2011      Chemistry      Component Value Date/Time   NA 141 10/04/2023 1556   K 3.8 10/04/2023 1556   CL 104 10/04/2023 1556   CO2 26 10/04/2023 1556   BUN 14 10/04/2023 1556   CREATININE 1.47 (H) 10/04/2023 1556      Component Value Date/Time   CALCIUM  9.3 10/04/2023 1556   ALKPHOS 87 05/21/2011 1753   AST 48 (H) 05/21/2011 1753   ALT 37 05/21/2011 1753   BILITOT 2.5 (H) 05/21/2011 1753      A/P: 1. Medication review: Patient on Humira  for psoriasis and continues to do well on it. Reviewed the medication with the patient, including the following: Humira  is a TNF blocking agent indicated for ankylosing spondylitis, Crohn's disease, Hidradenitis suppurativa, psoriatic arthritis, plaque psoriasis, ulcerative colitis, and uveitis. The most common adverse effects are infections, headache, and injection site reactions. There is the possibility of an increased risk of malignancy but it is not well understood if this increased risk is due to there medication or the disease state. There are rare cases of pancytopenia and aplastic anemia. No recommendations for changes.   Herlene Fleeta Morris, PharmD, JAQUELINE, CPP Clinical Pharmacist [facility] [phone]

## 2024-04-26 NOTE — Progress Notes (Signed)
 See OV from 04/26/2024 for complete documentation.   Corey Reilly, PharmD, JAQUELINE, CPP Clinical Pharmacist [facility] [phone]

## 2024-04-26 NOTE — Progress Notes (Signed)
 Pharmacy Patient Advocate Encounter  Insurance verification completed.   The patient is insured through [facility]   Ran test claim for Humira. PA required.   This test claim was processed through [facility]- copay amounts may vary at other pharmacies due to pharmacy/plan contracts, or as the patient moves through the different stages of their insurance plan.

## 2024-04-26 NOTE — Telephone Encounter (Signed)
 Pharmacy Patient Advocate Encounter   Received notification from Patient Pharmacy that prior authorization for Humira  is required/requested.   Insurance verification completed.   The patient is insured through [facility].   Per test claim: PA required; PA submitted to above mentioned insurance via Latent Key/confirmation #/EOC [id number] Status is pending

## 2024-04-28 ENCOUNTER — Other Ambulatory Visit: Payer: Self-pay

## 2024-04-28 NOTE — Progress Notes (Signed)
 PA approved-  Pharmacy Patient Advocate Encounter  Insurance verification completed.   The patient is insured through [facility]   Ran test claim for Humira . Co-pay is $0. Patient has copay card.   This test claim was processed through [facility]- copay amounts may vary at other pharmacies due to pharmacy/plan contracts, or as the patient moves through the different stages of their insurance plan.

## 2024-04-28 NOTE — Telephone Encounter (Signed)
 Pharmacy Patient Advocate Encounter  Received notification from [facility] that Prior Authorization for Humira  has been APPROVED from 04/26/24 to 04/25/25   PA #/Case ID/Reference #: [id number]

## 2024-05-01 ENCOUNTER — Other Ambulatory Visit: Payer: Self-pay

## 2024-05-01 ENCOUNTER — Other Ambulatory Visit (HOSPITAL_COMMUNITY): Payer: Self-pay

## 2024-05-01 NOTE — Progress Notes (Signed)
 Specialty Pharmacy Initial Fill Coordination Note  [name] is a 64 y.o. male contacted today regarding initial fill of specialty medication(s) Adalimumab  (Humira  (2 Pen)) (Humira )   Patient requested Pickup at [facility] [place] date: 05/01/24   Medication will be filled on: 05/01/24    Patient is aware of $0 copayment. Patient has copay card.

## 2024-05-02 ENCOUNTER — Other Ambulatory Visit: Payer: Self-pay

## 2024-05-03 ENCOUNTER — Other Ambulatory Visit (HOSPITAL_COMMUNITY): Payer: Self-pay

## 2024-05-04 ENCOUNTER — Other Ambulatory Visit: Payer: Self-pay

## 2024-05-08 ENCOUNTER — Other Ambulatory Visit: Payer: Self-pay

## 2024-05-14 ENCOUNTER — Other Ambulatory Visit: Payer: Self-pay | Admitting: Psychiatry

## 2024-05-14 DIAGNOSIS — F3110 Bipolar disorder, current episode manic without psychotic features, unspecified: Secondary | ICD-10-CM

## 2024-05-15 ENCOUNTER — Other Ambulatory Visit (HOSPITAL_COMMUNITY): Payer: Self-pay

## 2024-05-15 MED ORDER — QUETIAPINE FUMARATE 100 MG PO TABS
200.0000 mg | ORAL_TABLET | Freq: Every day | ORAL | 0 refills | Status: AC
  Filled 2024-05-15 – 2024-05-22 (×3): qty 180, 90d supply, fill #0
  Filled 2024-05-23: qty 60, 30d supply, fill #0
  Filled ????-??-??: fill #0

## 2024-05-19 ENCOUNTER — Other Ambulatory Visit (HOSPITAL_COMMUNITY): Payer: Self-pay

## 2024-05-23 ENCOUNTER — Other Ambulatory Visit (HOSPITAL_COMMUNITY): Payer: Self-pay

## 2024-05-25 ENCOUNTER — Other Ambulatory Visit: Payer: Self-pay

## 2024-05-25 NOTE — Progress Notes (Signed)
 Specialty Pharmacy Refill Coordination Note  Corey Reilly is a 64 y.o. male contacted today regarding refills of specialty medication(s) Adalimumab  (Humira  (2 Pen))   Patient requested Pickup at [facility] at [place] date: 06/02/24   Medication will be filled on: 06/01/24

## 2024-06-01 ENCOUNTER — Other Ambulatory Visit: Payer: Self-pay

## 2024-11-03 ENCOUNTER — Ambulatory Visit: Admitting: Urology
